# Patient Record
Sex: Female | Born: 1950 | Race: White | Hispanic: No | Marital: Married | Smoking: Current every day smoker
Health system: Southern US, Community
[De-identification: ages and names within clinical notes are randomized; demographics above are authoritative.]

## PROBLEM LIST (undated history)

## (undated) DIAGNOSIS — T7840XA Allergy, unspecified, initial encounter: Secondary | ICD-10-CM

## (undated) DIAGNOSIS — R519 Headache, unspecified: Secondary | ICD-10-CM

## (undated) DIAGNOSIS — R51 Headache: Secondary | ICD-10-CM

## (undated) DIAGNOSIS — Z8701 Personal history of pneumonia (recurrent): Secondary | ICD-10-CM

## (undated) HISTORY — DX: Allergy, unspecified, initial encounter: T78.40XA

## (undated) HISTORY — PX: DENTAL SURGERY: SHX609

## (undated) HISTORY — PX: HERNIA REPAIR: SHX51

---

## 2005-10-31 ENCOUNTER — Other Ambulatory Visit: Admission: RE | Admit: 2005-10-31 | Discharge: 2005-10-31 | Payer: Self-pay | Admitting: Obstetrics and Gynecology

## 2005-11-27 ENCOUNTER — Encounter: Admission: RE | Admit: 2005-11-27 | Discharge: 2005-11-27 | Payer: Self-pay | Admitting: Obstetrics and Gynecology

## 2006-05-09 ENCOUNTER — Ambulatory Visit (HOSPITAL_BASED_OUTPATIENT_CLINIC_OR_DEPARTMENT_OTHER): Admission: RE | Admit: 2006-05-09 | Discharge: 2006-05-09 | Payer: Self-pay | Admitting: Dentistry

## 2006-12-01 ENCOUNTER — Encounter: Admission: RE | Admit: 2006-12-01 | Discharge: 2006-12-01 | Payer: Self-pay | Admitting: Obstetrics and Gynecology

## 2007-12-02 ENCOUNTER — Encounter: Admission: RE | Admit: 2007-12-02 | Discharge: 2007-12-02 | Payer: Self-pay | Admitting: Obstetrics and Gynecology

## 2008-03-20 ENCOUNTER — Emergency Department (HOSPITAL_COMMUNITY): Admission: EM | Admit: 2008-03-20 | Discharge: 2008-03-20 | Payer: Self-pay | Admitting: Emergency Medicine

## 2008-09-21 ENCOUNTER — Encounter: Admission: RE | Admit: 2008-09-21 | Discharge: 2008-09-21 | Payer: Self-pay | Admitting: Emergency Medicine

## 2008-12-02 ENCOUNTER — Encounter: Admission: RE | Admit: 2008-12-02 | Discharge: 2008-12-02 | Payer: Self-pay | Admitting: Obstetrics and Gynecology

## 2009-12-04 ENCOUNTER — Encounter: Admission: RE | Admit: 2009-12-04 | Discharge: 2009-12-04 | Payer: Self-pay | Admitting: Obstetrics and Gynecology

## 2010-02-17 ENCOUNTER — Encounter: Payer: Self-pay | Admitting: Obstetrics and Gynecology

## 2010-05-15 LAB — COMPREHENSIVE METABOLIC PANEL
AST: 31 U/L (ref 0–37)
Albumin: 4.1 g/dL (ref 3.5–5.2)
Alkaline Phosphatase: 65 U/L (ref 39–117)
BUN: 15 mg/dL (ref 6–23)
Chloride: 106 mEq/L (ref 96–112)
GFR calc Af Amer: >60 mL/min (ref >60)
Potassium: 3.9 mEq/L (ref 3.5–5.1)
Total Bilirubin: 0.5 mg/dL (ref 0.3–1.2)

## 2010-05-15 LAB — DIFFERENTIAL
Basophils Absolute: 0.1 10*3/uL (ref 0.0–0.1)
Basophils Relative: 1 % (ref 0–1)
Eosinophils Relative: 2 % (ref 0–5)
Monocytes Absolute: 0.4 10*3/uL (ref 0.1–1.0)

## 2010-05-15 LAB — ETHANOL: Alcohol, Ethyl (B): 268 mg/dL — ABNORMAL HIGH (ref 0–10)

## 2010-05-15 LAB — CBC
HCT: 42.5 % (ref 36.0–46.0)
Platelets: 207 10*3/uL (ref 150–400)
WBC: 8 10*3/uL (ref 4.0–10.5)

## 2010-06-15 NOTE — Op Note (Signed)
Kimberly Le, Kimberly Le              ACCOUNT NO.:  [id number]   MEDICAL RECORD NO.:  [id number]          PATIENT TYPE:  AMB   LOCATION:  DSC                          FACILITY:  [facility]   PHYSICIAN:  Clydene Laming., D.D.S.DATE OF BIRTH:  [birth year]   DATE OF PROCEDURE:  05/09/2006  DATE OF DISCHARGE:                               OPERATIVE REPORT   Patient was premedicated and brought to the OR, placed on the table in  the supine position in which she remained throughout the entire  procedure.  After successful induction of nasoendotracheal anesthesia  the patient was prepped and draped in the usual manner for an intraoral  procedure.  The mouth and pharynx were suctioned dry.  No throat pack  was placed.  The axillary arch was infiltrated and mandibular arch was  anesthetized with bilateral block anesthesia with facial infiltration  using 9 dental carpules 1.8 mL of 2% lidocaine, 1:100,000 epinephrine.  In the maxillary arch there were inversed double incisions, facial and  palatal, distal wedges 2 and 15, full thickness flaps were elevated.  Teeth 2 and 12 were extracted.  In the mandibular arch, inversed bevel  incisions were facial and lingual.  There was a pie-shaped distal wedge  distal of #18.  Full thickness flaps were elevated, a #32 was extracted  in toto.  There was a very heavy calculus, scaled and replaned with hand  instrumentation and sonic instrumentation.  There was further thinning  and scalloping of soft tissues, osteoplasty was carried out where  indicated.  There were no significant vertical defects.  The extraction  site of #12 was degranulated.  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  Resorbable membranes were used and the socket was crafted with freeze-  dried bone.  After thorough irrigation flaps were irrigated.  They were  further thinned and scalloped.  Suture using 4-0 black silk suture  interrupted.  The mouth was suctioned dry.  Patient was then extubated  on the table and found to be breathing well on her own.  Blood loss was  minimal, there were no cultures or drains, there was no biopsy.  The  procedure went very smoothly.  It lasted approximately 3.5 hours.  The  patient was returned to PACU where she remained until stable.  Her  husband was with her to escort her home and he was also present for our  preoperative review of procedure.           ______________________________  Clydene Laming., D.D.S.     RJK/MEDQ  D:  05/09/2006  T:  05/09/2006  Job:  [id number]

## 2010-09-10 ENCOUNTER — Encounter: Payer: Self-pay | Admitting: Podiatry

## 2010-11-22 ENCOUNTER — Other Ambulatory Visit: Payer: Self-pay | Admitting: Obstetrics and Gynecology

## 2010-11-22 DIAGNOSIS — Z1231 Encounter for screening mammogram for malignant neoplasm of breast: Secondary | ICD-10-CM

## 2010-12-14 ENCOUNTER — Ambulatory Visit
Admission: RE | Admit: 2010-12-14 | Discharge: 2010-12-14 | Disposition: A | Discharge status: Home / Self Care | Payer: 59 | Source: Ambulatory Visit | Attending: Obstetrics and Gynecology | Admitting: Obstetrics and Gynecology

## 2010-12-14 DIAGNOSIS — Z1231 Encounter for screening mammogram for malignant neoplasm of breast: Secondary | ICD-10-CM

## 2012-01-17 ENCOUNTER — Other Ambulatory Visit: Payer: Self-pay | Admitting: Obstetrics and Gynecology

## 2012-01-17 DIAGNOSIS — Z1231 Encounter for screening mammogram for malignant neoplasm of breast: Secondary | ICD-10-CM

## 2012-02-13 ENCOUNTER — Ambulatory Visit: Payer: 59

## 2012-02-14 ENCOUNTER — Ambulatory Visit
Admission: RE | Admit: 2012-02-14 | Discharge: 2012-02-14 | Disposition: A | Discharge status: Home / Self Care | Payer: Managed Care, Other (non HMO) | Source: Ambulatory Visit | Attending: Obstetrics and Gynecology | Admitting: Obstetrics and Gynecology

## 2012-02-14 DIAGNOSIS — Z1231 Encounter for screening mammogram for malignant neoplasm of breast: Secondary | ICD-10-CM

## 2012-04-06 ENCOUNTER — Encounter: Payer: Self-pay | Admitting: Internal Medicine

## 2012-05-07 ENCOUNTER — Ambulatory Visit (AMBULATORY_SURGERY_CENTER): Payer: Managed Care, Other (non HMO) | Admitting: *Deleted

## 2012-05-07 ENCOUNTER — Encounter: Payer: Self-pay | Admitting: Internal Medicine

## 2012-05-07 VITALS — Ht 68.0 in | Wt 154.0 lb

## 2012-05-07 DIAGNOSIS — Z1211 Encounter for screening for malignant neoplasm of colon: Secondary | ICD-10-CM

## 2012-05-07 MED ORDER — PEG-KCL-NACL-NASULF-NA ASC-C 100 G PO SOLR: ORAL | Status: DC

## 2012-05-07 NOTE — Progress Notes (Signed)
No egg or soy allergy 

## 2012-05-21 ENCOUNTER — Ambulatory Visit (AMBULATORY_SURGERY_CENTER): Payer: Managed Care, Other (non HMO) | Admitting: Internal Medicine

## 2012-05-21 ENCOUNTER — Encounter: Payer: Self-pay | Admitting: Internal Medicine

## 2012-05-21 VITALS — BP 127/81 | HR 76 | Temp 97.5°F | Resp 25 | Ht 68.0 in | Wt 154.0 lb

## 2012-05-21 DIAGNOSIS — Z1211 Encounter for screening for malignant neoplasm of colon: Secondary | ICD-10-CM

## 2012-05-21 DIAGNOSIS — D126 Benign neoplasm of colon, unspecified: Secondary | ICD-10-CM

## 2012-05-21 MED ORDER — SODIUM CHLORIDE 0.9 % IV SOLN: 500.0000 mL | INTRAVENOUS | Status: DC

## 2012-05-21 NOTE — Progress Notes (Signed)
Called to room to assist during endoscopic procedure.  Patient ID and intended procedure confirmed with present staff. Received instructions for my participation in the procedure from the performing physician.  

## 2012-05-21 NOTE — Op Note (Signed)
[facility] [address] [place], [address]   COLONOSCOPY PROCEDURE REPORT  PATIENT: Kimberly Le, Kimberly Le  MR#: [id number] BIRTHDATE: 1950/12/25 , 61  yrs. old GENDER: Female ENDOSCOPIST: Beverley Fiedler, MD REFERRED XB:[id number], Todd PROCEDURE DATE:  05/21/2012 PROCEDURE:   Colonoscopy with snare polypectomy ASA CLASS:   Class I INDICATIONS:average risk screening and first colonoscopy. MEDICATIONS: MAC sedation, administered by CRNA and propofol (Diprivan) 400mg  IV  DESCRIPTION OF PROCEDURE:   After the risks benefits and alternatives of the procedure were thoroughly explained, informed consent was obtained.  A digital rectal exam revealed no rectal mass.   The LB PCF-[id number]  endoscope was introduced through the anus and advanced to the cecum, which was identified by both the appendix and ileocecal valve. No adverse events experienced. The quality of the prep was good, using MoviPrep  The instrument was then slowly withdrawn as the colon was fully examined.   COLON FINDINGS: A sessile polyp measuring 5 mm in size was found in the rectosigmoid colon.  A polypectomy was performed with a cold snare.  The resection was complete and the polyp tissue was completely retrieved.   Moderate diverticulosis was noted at the cecum, in the ascending colon, descending colon, and sigmoid colon. Retroflexed views revealed no abnormalities. The time to cecum=7 minutes 45 seconds.  Withdrawal time=9 minutes 11 seconds.  The scope was withdrawn and the procedure completed. COMPLICATIONS: There were no complications.  ENDOSCOPIC IMPRESSION: 1.   Sessile polyp measuring 5 mm in size was found in the rectosigmoid colon; polypectomy was performed with a cold snare 2.   Moderate diverticulosis was noted at the cecum, in the ascending colon, descending colon, and sigmoid colon  RECOMMENDATIONS: 1.  Await pathology results 2.  High fiber diet 3.  If the polyp removed today is  proven to be an adenomatous (pre-cancerous) polyp, you will need a repeat colonoscopy in 5 years.  Otherwise you should continue to follow colorectal cancer screening guidelines for "routine risk" patients with colonoscopy in 10 years.  You will receive a letter within 1-2 weeks with the results of your biopsy as well as final recommendations.  Please call my office if you have not received a letter after 3 weeks.   eSigned:  Beverley Fiedler, MD 05/21/2012 8:54 AM   cc: The Patient

## 2012-05-21 NOTE — Patient Instructions (Addendum)

## 2012-05-21 NOTE — Progress Notes (Signed)
Patient did not experience any of the following events: a burn prior to discharge; a fall within the facility; wrong site/side/patient/procedure/implant event; or a hospital transfer or hospital admission upon discharge from the facility. (G8907) Patient did not have preoperative order for IV antibiotic SSI prophylaxis. (G8918)  

## 2012-05-22 ENCOUNTER — Telehealth: Payer: Self-pay | Admitting: *Deleted

## 2012-05-22 NOTE — Telephone Encounter (Signed)
  Follow up Call-  Call back number 05/21/2012  Post procedure Call Back phone  # [phone]  Permission to leave phone message Yes    [facility]

## 2012-05-27 ENCOUNTER — Encounter: Payer: Self-pay | Admitting: Internal Medicine

## 2013-01-25 ENCOUNTER — Other Ambulatory Visit: Payer: Self-pay

## 2013-01-25 DIAGNOSIS — Z1231 Encounter for screening mammogram for malignant neoplasm of breast: Secondary | ICD-10-CM

## 2013-02-19 ENCOUNTER — Ambulatory Visit: Payer: Managed Care, Other (non HMO)

## 2013-03-10 ENCOUNTER — Ambulatory Visit: Payer: Managed Care, Other (non HMO)

## 2014-07-28 ENCOUNTER — Emergency Department (HOSPITAL_COMMUNITY): Payer: BLUE CROSS/BLUE SHIELD

## 2014-07-28 ENCOUNTER — Encounter (HOSPITAL_COMMUNITY): Payer: Self-pay | Admitting: Emergency Medicine

## 2014-07-28 ENCOUNTER — Emergency Department (HOSPITAL_COMMUNITY)
Admission: EM | Admit: 2014-07-28 | Discharge: 2014-07-28 | Disposition: A | Discharge status: Home / Self Care | Payer: BLUE CROSS/BLUE SHIELD | Attending: Emergency Medicine | Admitting: Emergency Medicine

## 2014-07-28 DIAGNOSIS — W010XXA Fall on same level from slipping, tripping and stumbling without subsequent striking against object, initial encounter: Secondary | ICD-10-CM | POA: Diagnosis not present

## 2014-07-28 DIAGNOSIS — S82892A Other fracture of left lower leg, initial encounter for closed fracture: Secondary | ICD-10-CM

## 2014-07-28 DIAGNOSIS — Y92009 Unspecified place in unspecified non-institutional (private) residence as the place of occurrence of the external cause: Secondary | ICD-10-CM | POA: Diagnosis not present

## 2014-07-28 DIAGNOSIS — Z79899 Other long term (current) drug therapy: Secondary | ICD-10-CM | POA: Diagnosis not present

## 2014-07-28 DIAGNOSIS — Y998 Other external cause status: Secondary | ICD-10-CM | POA: Diagnosis not present

## 2014-07-28 DIAGNOSIS — S99912A Unspecified injury of left ankle, initial encounter: Secondary | ICD-10-CM | POA: Diagnosis present

## 2014-07-28 DIAGNOSIS — Y9389 Activity, other specified: Secondary | ICD-10-CM | POA: Diagnosis not present

## 2014-07-28 DIAGNOSIS — S9305XA Dislocation of left ankle joint, initial encounter: Secondary | ICD-10-CM | POA: Diagnosis not present

## 2014-07-28 DIAGNOSIS — S82852A Displaced trimalleolar fracture of left lower leg, initial encounter for closed fracture: Secondary | ICD-10-CM | POA: Diagnosis not present

## 2014-07-28 DIAGNOSIS — Z720 Tobacco use: Secondary | ICD-10-CM | POA: Insufficient documentation

## 2014-07-28 MED ORDER — SODIUM CHLORIDE 0.9 % IV SOLN
INTRAVENOUS | Status: AC | PRN
  Administered 2014-07-28: 1000 mL via INTRAVENOUS

## 2014-07-28 MED ORDER — HYDROMORPHONE HCL 1 MG/ML IJ SOLN
1.0000 mg | Freq: Once | INTRAMUSCULAR | Status: AC
  Administered 2014-07-28: 1 mg via INTRAVENOUS
  Filled 2014-07-28: qty 1

## 2014-07-28 MED ORDER — ONDANSETRON HCL 4 MG/2ML IJ SOLN
4.0000 mg | Freq: Once | INTRAMUSCULAR | Status: AC
  Administered 2014-07-28: 4 mg via INTRAVENOUS
  Filled 2014-07-28: qty 2

## 2014-07-28 MED ORDER — OXYCODONE-ACETAMINOPHEN 5-325 MG PO TABS: 1.0000 | ORAL_TABLET | Freq: Four times a day (QID) | ORAL | Status: DC | PRN

## 2014-07-28 MED ORDER — ONDANSETRON 8 MG PO TBDP: 8.0000 mg | ORAL_TABLET | Freq: Three times a day (TID) | ORAL | Status: DC | PRN

## 2014-07-28 MED ORDER — PROPOFOL 10 MG/ML IV BOLUS
0.5000 mg/kg | Freq: Once | INTRAVENOUS | Status: AC
  Administered 2014-07-28: 33.1 mg via INTRAVENOUS
  Filled 2014-07-28: qty 1

## 2014-07-28 NOTE — ED Notes (Signed)
Per ems pt was seen at PCP for left ankle injury. Pt fell yesterday when tripped over her two dogs. Pt presents with splint by ems. Pt received 100 mcg Fentanyl IV. Xray  at PCP and confirmed fractured ankle.

## 2014-07-28 NOTE — ED Notes (Signed)
Bed: KP22 Expected date:  Expected time:  Means of arrival:  Comments: Ems- ankle injury

## 2014-07-28 NOTE — Discharge Instructions (Signed)
It was our pleasure to provide your ER care today - we hope that you feel better.  Elevate your ankle (above the level of your heart), as much as possible for the next few days - this will help both your swelling and pain a lot.  No weight bearing on your left foot/leg until cleared to do so by your orthopedist.  Use crutches. Keep splint clean/dry.   Ice/coldpacks to sore area.  You may take percocet as need for pain. No driving for the next day, or when taking percocet. Also, do not take tylenol or acetaminophen containing medication when taking percocet.   You may take zofran as need for nausea.   Pain medication can cause constipation.  If constipated, drink adequate fluids, get adequate fiber in diet, and take colace (stool softener) and miralax (laxative) as need.   Follow up with orthopedist tomorrow - be at his office at 830 AM - he will see you then, likely re-xray your ankle, and discuss/plan surgery for next week once the swelling is better.   Return to ER if worse, new symptoms, severe or intractable pain, numbness/weakness, new symptoms, other concern.       Ankle Fracture A fracture is a break in a bone. The ankle joint is made up of three bones. These include the lower (distal)sections of your lower leg bones, called the tibia and fibula, along with a bone in your foot, called the talus. Depending on how bad the break is and if more than one ankle joint bone is broken, a cast or splint is used to protect and keep your injured bone from moving while it heals. Sometimes, surgery is required to help the fracture heal properly.  There are two general types of fractures:  Stable fracture. This includes a single fracture line through one bone, with no injury to ankle ligaments. A fracture of the talus that does not have any displacement (movement of the bone on either side of the fracture line) is also stable.  Unstable fracture. This includes more than one fracture line  through one or more bones in the ankle joint. It also includes fractures that have displacement of the bone on either side of the fracture line. CAUSES  A direct blow to the ankle.   Quickly and severely twisting your ankle.  Trauma, such as a car accident or falling from a significant height. RISK FACTORS You may be at a higher risk of ankle fracture if:  You have certain medical conditions.  You are involved in high-impact sports.  You are involved in a high-impact car accident. SIGNS AND SYMPTOMS  1. Tender and swollen ankle. 2. Bruising around the injured ankle. 3. Pain on movement of the ankle. 4. Difficulty walking or putting weight on the ankle. 5. A cold foot below the site of the ankle injury. This can occur if the blood vessels passing through your injured ankle were also damaged. 6. Numbness in the foot below the site of the ankle injury. DIAGNOSIS  An ankle fracture is usually diagnosed with a physical exam and X-rays. A CT scan may also be required for complex fractures. TREATMENT  Stable fractures are treated with a cast or splint and using crutches to avoid putting weight on your injured ankle. This is followed by an ankle strengthening program. Some patients require a special type of cast, depending on other medical problems they may have. Unstable fractures require surgery to ensure the bones heal properly. Your health care provider will tell  you what type of fracture you have and the best treatment for your condition. HOME CARE INSTRUCTIONS  1. Review correct crutch use with your health care provider and use your crutches as directed. Safe use of crutches is extremely important. Misuse of crutches can cause you to fall or cause injury to nerves in your hands or armpits. 2. Do not put weight or pressure on the injured ankle until directed by your health care provider. 3. To lessen the swelling, keep the injured leg elevated while sitting or lying down. 4. Apply ice to  the injured area: 1. Put ice in a plastic bag. 2. Place a towel between your cast and the bag. 3. Leave the ice on for 20 minutes, 2-3 times a day. 5. If you have a plaster or fiberglass cast: 1. Do not try to scratch the skin under the cast with any objects. This can increase your risk of skin infection. 2. Check the skin around the cast every day. You may put lotion on any red or sore areas. 3. Keep your cast dry and clean. 6. If you have a plaster splint: 1. Wear the splint as directed. 2. You may loosen the elastic around the splint if your toes become numb, tingle, or turn cold or blue. 7. Do not put pressure on any part of your cast or splint; it may break. Rest your cast only on a pillow the first 24 hours until it is fully hardened. 8. Your cast or splint can be protected during bathing with a plastic bag sealed to your skin with medical tape. Do not lower the cast or splint into water. 9. Take medicines as directed by your health care provider. Only take over-the-counter or prescription medicines for pain, discomfort, or fever as directed by your health care provider. 10. Do not drive a vehicle until your health care provider specifically tells you it is safe to do so. 11. If your health care provider has given you a follow-up appointment, it is very important to keep that appointment. Not keeping the appointment could result in a chronic or permanent injury, pain, and disability. If you have any problem keeping the appointment, call the facility for assistance. SEEK MEDICAL CARE IF: You develop increased swelling or discomfort. SEEK IMMEDIATE MEDICAL CARE IF:  1. Your cast gets damaged or breaks. 2. You have continued severe pain. 3. You develop new pain or swelling after the cast was put on. 4. Your skin or toenails below the injury turn blue or gray. 5. Your skin or toenails below the injury feel cold, numb, or have loss of sensitivity to touch. 6. There is a bad smell or pus  draining from under the cast. MAKE SURE YOU:  1. Understand these instructions. 2. Will watch your condition. 3. Will get help right away if you are not doing well or get worse. Document Released: 01/12/2000 Document Revised: 01/19/2013 Document Reviewed: 08/13/2012 [facility] Patient Information 2015 [place], [place]. This information is not intended to replace advice given to you by your health care provider. Make sure you discuss any questions you have with your health care provider.    Trimalleolar Fracture, Ankle, Adult, Displaced (ORIF) A trimalleolar fracture (break in bone) is three fractures in the lower bones of your leg that make up your ankle. These fractures are in the bone you feel as the bump on the outside of your ankle (fibula) and the bone that you feel as the bump on the inside of your ankle (tibia). Your  fractures are displaced. This means the bones are not in their normal position and will not give a good result if they heal in that position. Because of this, surgery is required. This is called an open reduction and internal fixation (ORIF). Even with the best of care and perfect results this ankle may be more prone to be arthritis later due to damage of the cartilage lining the ankle joint which is not visible on x-ray. These fractures are diagnosed with x-rays. TREATMENT  You have fractures that would probably heal with disability, without surgery. Open reduction means that the area of the fracture is opened up to the vision of the surgeon and internal fixation means that a screw, pins or fixation device is used to hold the boney pieces in place. After surgery a short-leg cast or removable fracture boot is then applied from your toes to below your knee. This is generally left in place for about 5 to 6 weeks, during which time it is followed by your caregiver and x-rays may be taken to make sure the bones stay in place. LET YOUR CAREGIVER KNOW ABOUT:  Allergies  Medications taken  including herbs, eye drops, over the counter medications, and creams  Use of steroids (by mouth or creams)  History of bleeding or blood problems  A family history of anesthetic problems.  Previous problems with anesthetics or novocaine, including a family history of these problems.  Possibility of pregnancy, if this applies  History of blood clots (thrombophlebitis)  Previous surgery  Other health problems RISKS AND COMPLICATIONS All surgery is associated with risks. Some of these risks are:  Excessive bleeding  Infection  Post traumatic arthritis.  Failure to heal properly resulting in an unstable or arthritic ankle  Stiffness of ankle following repair  The need to have some of the hardware removed. BEFORE AND AFTER SURGERY Prior to surgery an IV (intravenous line connected to your vein for giving fluids) may be started and you will be given an anesthetic (this may be medicine and gas to make you go to sleep or you may receive a spinal anesthetic which will make your legs numb for the period of time necessary for your surgery). You may also be given a regional anesthetic such as a spinal or epidural block. After surgery, you will be taken to the recovery area where a nurse will monitor your progress. You may have a catheter (a long, narrow, hollow tube) in your bladder following surgery that helps you pass your water. If you stay in the hospital, when you are awake, stable, taking fluids well and without complications, you will be returned to your room. You will receive physical therapy and other care until you are doing well and your caregiver feels it is safe for you to be transferred either to home or to an extended care facility. HOME CARE INSTRUCTIONS   You may resume normal diet and activities as directed or allowed.  Keep ice packs (a bag of ice wrapped in a towel) on the surgical area for twenty minutes, four times per day, for the first two days following surgery. Use  the ice only if OK with your surgeon or caregiver.  Elevate your ankle above your heart as much as possible for the first 24-48 hours after the operation.  Change dressings if necessary or as directed.  If you have a plaster or fiberglass cast:  Do not try to scratch the skin under the cast using sharp or pointed objects.  Check  the skin around the cast every day. You may put lotion on any red or sore areas.  Keep your cast dry and clean.  Do not put pressure on any part of your cast or splint until it is fully hardened.  Your cast or splint can be protected during bathing with a plastic bag. Do not lower the cast or splint into water.  Only take over-the-counter or prescription medicines for pain, discomfort, or fever as directed by your caregiver.  Use crutches as directed and do not exercise leg unless instructed.  These are not fractures to be taken lightly! If these bones become displaced and get out of position, it may eventually lead to arthritis and disability for the rest of your life. Problems often follow even the best of care. Follow the directions of your caregiver.  Keep appointments as directed.  Elevate your injured ankle above your heart as much as possible for the first 5-7 days.  If you are placed into a fracture boot after surgery only remove is as instructed by your caregiver. SEEK IMMEDIATE MEDICAL CARE IF:  7. Redness, swelling, numbness or increasing pain in the wound. 8. Pus coming from wound. 9. An unexplained oral temperature above 102 F (38.9 C), or as your caregiver suggests. 10. A bad smell coming from the wound or dressing. 11. A breaking open of the wound (edges not staying together) after sutures or staples have been removed. 12. Your skin or nails below the injury turn blue or gray, or feel cold or numb. 13. You develop severe pain under the cast or in your foot. Especially when someone else moves your toes. Follow all instructions given to you  by your caregiver, make and keep follow up appointments, and use crutches as directed. If you do not have a window in your cast for observing the wound, a discharge or minor bleeding may show up as a stain on the outside of your dressings, your cast or plaster splint. Report these findings to your caregiver. If you are given a fracture boot, minor bleeding on the dressing is common. This is not of major concern. Change the dressing as instructed by your caregiver. Document Released: 10/06/2001 Document Revised: 04/08/2011 Document Reviewed: 03/04/2013 [facility] Patient Information 2015 [place], [place]. This information is not intended to replace advice given to you by your health care provider. Make sure you discuss any questions you have with your health care provider.  Cryotherapy Cryotherapy means treatment with cold. Ice or gel packs can be used to reduce both pain and swelling. Ice is the most helpful within the first 24 to 48 hours after an injury or flare-up from overusing a muscle or joint. Sprains, strains, spasms, burning pain, shooting pain, and aches can all be eased with ice. Ice can also be used when recovering from surgery. Ice is effective, has very few side effects, and is safe for most people to use. PRECAUTIONS  Ice is not a safe treatment option for people with:  Raynaud phenomenon. This is a condition affecting small blood vessels in the extremities. Exposure to cold may cause your problems to return.  Cold hypersensitivity. There are many forms of cold hypersensitivity, including:  Cold urticaria. Red, itchy hives appear on the skin when the tissues begin to warm after being iced.  Cold erythema. This is a red, itchy rash caused by exposure to cold.  Cold hemoglobinuria. Red blood cells break down when the tissues begin to warm after being iced. The hemoglobin that carry  oxygen are passed into the urine because they cannot combine with blood proteins fast enough.  Numbness or  altered sensitivity in the area being iced. If you have any of the following conditions, do not use ice until you have discussed cryotherapy with your caregiver:  Heart conditions, such as arrhythmia, angina, or chronic heart disease.  High blood pressure.  Healing wounds or open skin in the area being iced.  Current infections.  Rheumatoid arthritis.  Poor circulation.  Diabetes. Ice slows the blood flow in the region it is applied. This is beneficial when trying to stop inflamed tissues from spreading irritating chemicals to surrounding tissues. However, if you expose your skin to cold temperatures for too long or without the proper protection, you can damage your skin or nerves. Watch for signs of skin damage due to cold. HOME CARE INSTRUCTIONS Follow these tips to use ice and cold packs safely.  Place a dry or damp towel between the ice and skin. A damp towel will cool the skin more quickly, so you may need to shorten the time that the ice is used.  For a more rapid response, add gentle compression to the ice.  Ice for no more than 10 to 20 minutes at a time. The bonier the area you are icing, the less time it will take to get the benefits of ice.  Check your skin after 5 minutes to make sure there are no signs of a poor response to cold or skin damage.  Rest 20 minutes or more between uses.  Once your skin is numb, you can end your treatment. You can test numbness by very lightly touching your skin. The touch should be so light that you do not see the skin dimple from the pressure of your fingertip. When using ice, most people will feel these normal sensations in this order: cold, burning, aching, and numbness.  Do not use ice on someone who cannot communicate their responses to pain, such as small children or people with dementia. HOW TO MAKE AN ICE PACK Ice packs are the most common way to use ice therapy. Other methods include ice massage, ice baths, and cryosprays. Muscle  creams that cause a cold, tingly feeling do not offer the same benefits that ice offers and should not be used as a substitute unless recommended by your caregiver. To make an ice pack, do one of the following: 14. Place crushed ice or a bag of frozen vegetables in a sealable plastic bag. Squeeze out the excess air. Place this bag inside another plastic bag. Slide the bag into a pillowcase or place a damp towel between your skin and the bag. 15. Mix 3 parts water with 1 part rubbing alcohol. Freeze the mixture in a sealable plastic bag. When you remove the mixture from the freezer, it will be slushy. Squeeze out the excess air. Place this bag inside another plastic bag. Slide the bag into a pillowcase or place a damp towel between your skin and the bag. SEEK MEDICAL CARE IF: 12. You develop white spots on your skin. This may give the skin a blotchy (mottled) appearance. 13. Your skin turns blue or pale. 14. Your skin becomes waxy or hard. 15. Your swelling gets worse. MAKE SURE YOU:  7. Understand these instructions. 8. Will watch your condition. 9. Will get help right away if you are not doing well or get worse. Document Released: 09/10/2010 Document Revised: 05/31/2013 Document Reviewed: 09/10/2010 [facility] Patient Information 2015 [place], [place]. This  information is not intended to replace advice given to you by your health care provider. Make sure you discuss any questions you have with your health care provider.   Crutch Use Crutches are used to take weight off one of your legs or feet when you stand or walk. It is important to use crutches that fit properly. When fitted properly:  Each crutch should be 2-3 finger widths below the armpit.  Your weight should be supported by your hand, and not by resting the armpit on the crutch.  RISKS AND COMPLICATIONS Damage to the nerves that extend from your armpit to your hand and arm. To prevent this from happening, make sure your crutches fit  properly and do not put pressure on your armpit when using them. HOW TO USE YOUR CRUTCHES If you have been instructed to use partial weight bearing, apply (bear) the amount of weight as your health care provider suggests. Do not bear weight in an amount that causes pain to the area of injury. Walking  Step with the crutches.  Swing the healthy leg slightly ahead of the crutches. Going Up Steps If there is no handrail:  Step up with the healthy leg.  Step up with the crutches and injured leg.  Continue in this way. If there is a handrail: 16. Hold both crutches in one hand. 17. Place your free hand on the handrail. 18. While putting your weight on your arms, lift your healthy leg to the step. 19. Bring the crutches and the injured leg up to that step. 20. Continue in this way. Going Down Steps Be very careful, as going down stairs with crutches is very challenging. If there is no handrail: 16. Step down with the injured leg and crutches. 17. Step down with the healthy leg. If there is a handrail: 10. Place your hand on the handrail. 11. Hold both crutches with your free hand. 12. Lower your injured leg and crutch to the step below you. Make sure to keep the crutch tips in the center of the step, never on the edge. 13. Lower your healthy leg to that step. 14. Continue in this way. Standing Up 4. Hold the injured leg forward. 5. Grab the armrest with one hand and the top of the crutches with the other hand. 6. Using these supports, pull yourself up to a standing position. Sitting Down 1. Hold the injured leg forward. 2. Grab the armrest with one hand and the top of the crutches with the other hand. 3. Lower yourself to a sitting position. SEEK MEDICAL CARE IF:  You still feel unsteady on your feet.  You develop new pain, for example in your armpits, back, shoulder, wrist, or hip.  You develop any numbness or tingling. SEEK IMMEDIATE MEDICAL CARE IF: You fall. Document  Released: 01/12/2000 Document Revised: 01/19/2013 Document Reviewed: 09/21/2012 [facility] Patient Information 2015 [place], [place]. This information is not intended to replace advice given to you by your health care provider. Make sure you discuss any questions you have with your health care provider.   Cast or Splint Care Casts and splints support injured limbs and keep bones from moving while they heal. It is important to care for your cast or splint at home.  HOME CARE INSTRUCTIONS  Keep the cast or splint uncovered during the drying period. It can take 24 to 48 hours to dry if it is made of plaster. A fiberglass cast will dry in less than 1 hour.  Do not rest the  cast on anything harder than a pillow for the first 24 hours.  Do not put weight on your injured limb or apply pressure to the cast until your health care provider gives you permission.  Keep the cast or splint dry. Wet casts or splints can lose their shape and may not support the limb as well. A wet cast that has lost its shape can also create harmful pressure on your skin when it dries. Also, wet skin can become infected.  Cover the cast or splint with a plastic bag when bathing or when out in the rain or snow. If the cast is on the trunk of the body, take sponge baths until the cast is removed.  If your cast does become wet, dry it with a towel or a blow dryer on the cool setting only.  Keep your cast or splint clean. Soiled casts may be wiped with a moistened cloth.  Do not place any hard or soft foreign objects under your cast or splint, such as cotton, toilet paper, lotion, or powder.  Do not try to scratch the skin under the cast with any object. The object could get stuck inside the cast. Also, scratching could lead to an infection. If itching is a problem, use a blow dryer on a cool setting to relieve discomfort.  Do not trim or cut your cast or remove padding from inside of it.  Exercise all joints next to the  injury that are not immobilized by the cast or splint. For example, if you have a long leg cast, exercise the hip joint and toes. If you have an arm cast or splint, exercise the shoulder, elbow, thumb, and fingers.  Elevate your injured arm or leg on 1 or 2 pillows for the first 1 to 3 days to decrease swelling and pain.It is best if you can comfortably elevate your cast so it is higher than your heart. SEEK MEDICAL CARE IF:   Your cast or splint cracks.  Your cast or splint is too tight or too loose.  You have unbearable itching inside the cast.  Your cast becomes wet or develops a soft spot or area.  You have a bad smell coming from inside your cast.  You get an object stuck under your cast.  Your skin around the cast becomes red or raw.  You have new pain or worsening pain after the cast has been applied. SEEK IMMEDIATE MEDICAL CARE IF:   You have fluid leaking through the cast.  You are unable to move your fingers or toes.  You have discolored (blue or white), cool, painful, or very swollen fingers or toes beyond the cast.  You have tingling or numbness around the injured area.  You have severe pain or pressure under the cast.  You have any difficulty with your breathing or have shortness of breath.  You have chest pain. Document Released: 01/12/2000 Document Revised: 11/04/2012 Document Reviewed: 07/23/2012 [facility] Patient Information 2015 [place], [place]. This information is not intended to replace advice given to you by your health care provider. Make sure you discuss any questions you have with your health care provider.

## 2014-07-28 NOTE — ED Provider Notes (Signed)
CSN: [id number]     Arrival date & time 07/28/14  85 History   First MD Initiated Contact with Patient 07/28/14 1107     Chief Complaint  Patient presents with  . left ankle injury      (Consider location/radiation/quality/duration/timing/severity/associated sxs/prior Treatment) The history is provided by the patient, the spouse and the EMS personnel.  Patient s/p trip and fall at home last pm, tripping over her two dogs, and injuring left ankle. Ankle pain constant, mod-severe, worse w movement. Unable to bear weight or walk on ankle. Skin intact. Denies knee pain or other injury. No faintness or dizziness prior to fall. No head injury w fall. No headache. No neck or back pain. Pt denies any other pain or injury. No numbness/weakness. No anticoag use. Last ate/drank last pm.      Past Medical History  Diagnosis Date  . Allergy     seasonal   Past Surgical History  Procedure Laterality Date  . Hernia repair    . Dental surgery     Family History  Problem Relation Age of Onset  . Colon cancer Neg Hx   . Esophageal cancer Neg Hx   . Rectal cancer Neg Hx   . Stomach cancer Neg Hx    History  Substance Use Topics  . Smoking status: Current Every Day Smoker -- 0.25 packs/day  . Smokeless tobacco: Never Used  . Alcohol Use: Yes     Comment: social   OB History    No data available     Review of Systems  Constitutional: Negative for fever.  HENT: Negative for nosebleeds.   Eyes: Negative for redness.  Respiratory: Negative for shortness of breath.   Cardiovascular: Negative for chest pain.  Gastrointestinal: Negative for nausea, vomiting and abdominal pain.  Genitourinary: Negative for flank pain.  Musculoskeletal: Negative for back pain and neck pain.  Skin: Negative for rash and wound.  Neurological: Negative for weakness, numbness and headaches.  Hematological: Does not bruise/bleed easily.  Psychiatric/Behavioral: Negative for confusion.      Allergies   Review of patient's allergies indicates no known allergies.  Home Medications   Prior to Admission medications   Medication Sig Start Date End Date Taking? Authorizing Provider  ALPRAZolam (XANAX) 0.25 MG tablet Take 0.25 mg by mouth 2 (two) times daily as needed for anxiety.  06/23/14  Yes Historical Provider, MD  buPROPion (WELLBUTRIN XL) 150 MG 24 hr tablet Take 150 mg by mouth daily.   Yes Historical Provider, MD  Multiple Vitamins-Minerals (CENTRUM PO) Take 1 tablet by mouth daily.    Yes Historical Provider, MD  venlafaxine (EFFEXOR) 75 MG tablet Take 75 mg by mouth daily.   Yes Historical Provider, MD  peg 3350 powder (MOVIPREP) 100 G SOLR Moviprep as directed, no substitutions Patient not taking: Reported on 07/28/2014 05/07/12   Jerene Bears, MD   BP 144/70 mmHg  Pulse 71  Temp(Src) 98.1 F (36.7 C) (Oral)  Resp 20  SpO2 94% Physical Exam  Constitutional: She is oriented to person, place, and time. She appears well-developed and well-nourished. No distress.  HENT:  Head: Atraumatic.  Eyes: Conjunctivae are normal. No scleral icterus.  Neck: Neck supple. No tracheal deviation present.  Cardiovascular: Normal rate and intact distal pulses.   Pulmonary/Chest: Effort normal. No respiratory distress.  Abdominal: Normal appearance. She exhibits no distension.  Musculoskeletal:  Moderate swelling to left ankle. Distal pulses palp. No proximal tib/fib or knee tenderness. No other focal bony tenderness  on ext exam. CTLS spine, non tender, aligned, no step off. Small superficial abrasion to ankle- pt indicates was present prior to injury. No open fx/lac.    Neurological: She is alert and oriented to person, place, and time.  Able to move toes/foot. sens grossly intact.   Skin: Skin is warm and dry. No rash noted.  Psychiatric: She has a normal mood and affect.  Nursing note and vitals reviewed.   ED Course  Procedures (including critical care time) Labs Review  Dg Ankle  Complete Left  07/28/2014   CLINICAL DATA:  Post reduction.  Recent fall.  EXAM: LEFT ANKLE COMPLETE - 3+ VIEW  COMPARISON:  Radiograph earlier today.  FINDINGS: Improved position alignment of trimalleolar fracture and posterior dislocation. There is still significant disruption of the ankle mortise, and anterior displacement of the tibia on the talus. Posterior splint satisfactory position.  IMPRESSION: As above.   Electronically Signed   By: Staci Righter M.D.   On: 07/28/2014 12:47   Dg Ankle Complete Left  07/28/2014   CLINICAL DATA:  64 year old female with a history of fall and injury.  EXAM: LEFT ANKLE COMPLETE - 3+ VIEW  COMPARISON:  None.  FINDINGS: Fracture dislocation at the ankle.  Comminuted fracture of the distal fibular diaphysis, above the ankle mortise, with 23 degrees of angulation anteriorly.  Comminuted fracture of the distal tibia, involving the posterior malleolus and the medial malleolus. There has been posterior dislocation of the talus from the ankle mortise.  IMPRESSION: Fracture dislocation of the ankle, with posterior dislocation of the talus.  Comminuted fracture of the distal tibia involving the medial malleolus and the posterior malleolus. There is also comminuted fracture of the distal fibula, with approximately 23 degrees of anterior angulation at the fracture site  Signed,  Dulcy Fanny. Earleen Newport, DO  [facility]  [facility]   Electronically Signed   By: Corrie Mckusick D.O.   On: 07/28/2014 11:29       MDM   Iv ns. Xrays.  Dilaudid iv. zofran iv.  Reviewed nursing notes and prior charts for additional history.   Orthopedist paged.  Dr Alain Marion reviewed initial films, requests we reduce in ED and call him back.  Propofol/procedural sedation used. Initial 4 cc/40 mg with incomplete sedation.  An additional 4 cc given, w good sedation.  w ortho tech, fx/dislocation reduced, tends to want to sublux back anter, reduction  maintained, swelling and appearance improved, distal pulses palp.  Pt tolerated procedure well. Skin remains intact. Dp/pt 2+. Posterior splint with stirrups, and very good padding applied.   Again discussed with Dr Percell Miller who reviewed post reduction films - indicates to leave as is, no wt bearing, and have f/u at 0830 tomorrow w him, w tentative plan for surgery early/mid next week.  Recheck pt, fully awake and alert, tolerating po fluids. No new c/o. Pain controlled. No numbness/tingling/weakness. Sensation and movement intact in toes, dp palp, normal cap refill in toes.   Discussed elevation, no wt bearing, and f/u plan. Pt/spouse agreeable.  Procedural sedation Performed by: Mirna Mires Consent: Verbal consent obtained. Risks and benefits: risks, benefits and alternatives were discussed Required items: required blood products, implants, devices, and special equipment available Patient identity confirmed: arm band and provided demographic data Time out: Immediately prior to procedure a "time out" was called to verify the correct patient, procedure, equipment, support staff and site/side marked as required.  Sedation type: moderate (conscious) sedation NPO time confirmed and  considedered  Sedatives: PROPOFOL  Physician Time at Bedside: 30 min  Vitals: Vital signs were monitored during sedation. Cardiac Monitor, pulse oximeter Patient tolerance: Patient tolerated the procedure well with no immediate complications. Comments: Pt with uneventful recovered. Returned to pre-procedural sedation baseline     Lajean Saver, MD 07/28/14 1353

## 2014-07-28 NOTE — Progress Notes (Signed)
Pt with left ankle fx  CM assessed pt for support with getting medications, food, ADLs  Pt has female visitor at bedside to state he will be assisting with all of these needs for pt and will assist with getting her to appt with Dr Percell Miller on 07/29/14  Pt has crutches in room No identified need at this time

## 2014-07-29 ENCOUNTER — Observation Stay (HOSPITAL_COMMUNITY)
Admission: AD | Admit: 2014-07-29 | Discharge: 2014-07-30 | Disposition: A | Discharge status: Home / Self Care | Payer: BLUE CROSS/BLUE SHIELD | Source: Ambulatory Visit | Attending: Orthopedic Surgery | Admitting: Orthopedic Surgery

## 2014-07-29 ENCOUNTER — Encounter (HOSPITAL_COMMUNITY): Payer: Self-pay | Admitting: General Practice

## 2014-07-29 ENCOUNTER — Ambulatory Visit (HOSPITAL_COMMUNITY): Payer: BLUE CROSS/BLUE SHIELD

## 2014-07-29 ENCOUNTER — Encounter (HOSPITAL_COMMUNITY)
Admission: AD | Disposition: A | Discharge status: Home / Self Care | Payer: Self-pay | Source: Ambulatory Visit | Attending: Orthopedic Surgery

## 2014-07-29 ENCOUNTER — Ambulatory Visit (HOSPITAL_COMMUNITY): Payer: BLUE CROSS/BLUE SHIELD | Admitting: Anesthesiology

## 2014-07-29 DIAGNOSIS — S82852A Displaced trimalleolar fracture of left lower leg, initial encounter for closed fracture: Secondary | ICD-10-CM | POA: Diagnosis not present

## 2014-07-29 DIAGNOSIS — X58XXXA Exposure to other specified factors, initial encounter: Secondary | ICD-10-CM | POA: Diagnosis not present

## 2014-07-29 DIAGNOSIS — F17210 Nicotine dependence, cigarettes, uncomplicated: Secondary | ICD-10-CM | POA: Insufficient documentation

## 2014-07-29 DIAGNOSIS — S82899A Other fracture of unspecified lower leg, initial encounter for closed fracture: Secondary | ICD-10-CM | POA: Diagnosis present

## 2014-07-29 HISTORY — DX: Headache, unspecified: R51.9

## 2014-07-29 HISTORY — PX: ORIF ANKLE FRACTURE: SHX5408

## 2014-07-29 HISTORY — DX: Personal history of pneumonia (recurrent): Z87.01

## 2014-07-29 HISTORY — DX: Headache: R51

## 2014-07-29 LAB — CBC
HCT: 38 % (ref 36.0–46.0)
Hemoglobin: 12.4 g/dL (ref 12.0–15.0)
MCH: 31.1 pg (ref 26.0–34.0)
MCHC: 32.6 g/dL (ref 30.0–36.0)
MCV: 95.2 fL (ref 78.0–100.0)
PLATELETS: 218 10*3/uL (ref 150–400)
RBC: 3.99 MIL/uL (ref 3.87–5.11)
RDW: 13.4 % (ref 11.5–15.5)
WBC: 7.3 10*3/uL (ref 4.0–10.5)

## 2014-07-29 LAB — BASIC METABOLIC PANEL
ANION GAP: 8 (ref 5–15)
BUN: 11 mg/dL (ref 6–20)
CO2: 28 mmol/L (ref 22–32)
CREATININE: 0.78 mg/dL (ref 0.44–1.00)
Calcium: 9.3 mg/dL (ref 8.9–10.3)
Chloride: 102 mmol/L (ref 101–111)
GFR calc Af Amer: >60 mL/min (ref >60)
GLUCOSE: 95 mg/dL (ref 65–99)
Potassium: 3.8 mmol/L (ref 3.5–5.1)
Sodium: 138 mmol/L (ref 135–145)

## 2014-07-29 LAB — SURGICAL PCR SCREEN
MRSA, PCR: NEGATIVE
STAPHYLOCOCCUS AUREUS: NEGATIVE

## 2014-07-29 SURGERY — OPEN REDUCTION INTERNAL FIXATION (ORIF) ANKLE FRACTURE
Anesthesia: General | Site: Ankle | Laterality: Left

## 2014-07-29 MED ORDER — ONDANSETRON HCL 4 MG PO TABS: 4.0000 mg | ORAL_TABLET | Freq: Four times a day (QID) | ORAL | Status: DC | PRN

## 2014-07-29 MED ORDER — DOCUSATE SODIUM 100 MG PO CAPS
100.0000 mg | ORAL_CAPSULE | Freq: Two times a day (BID) | ORAL | Status: DC
  Administered 2014-07-29 – 2014-07-30 (×2): 100 mg via ORAL
  Filled 2014-07-29 (×2): qty 1

## 2014-07-29 MED ORDER — VENLAFAXINE HCL ER 75 MG PO CP24
75.0000 mg | ORAL_CAPSULE | Freq: Every day | ORAL | Status: DC
  Administered 2014-07-30: 75 mg via ORAL
  Filled 2014-07-29 (×2): qty 1

## 2014-07-29 MED ORDER — OXYCODONE-ACETAMINOPHEN 5-325 MG PO TABS: 1.0000 | ORAL_TABLET | ORAL | Status: DC | PRN

## 2014-07-29 MED ORDER — ADULT MULTIVITAMIN W/MINERALS CH
1.0000 | ORAL_TABLET | Freq: Every day | ORAL | Status: DC
  Administered 2014-07-30: 1 via ORAL
  Filled 2014-07-29 (×2): qty 1

## 2014-07-29 MED ORDER — FENTANYL CITRATE (PF) 100 MCG/2ML IJ SOLN
INTRAMUSCULAR | Status: DC | PRN
  Administered 2014-07-29 (×2): 25 ug via INTRAVENOUS
  Administered 2014-07-29: 100 ug via INTRAVENOUS
  Administered 2014-07-29 (×2): 50 ug via INTRAVENOUS
  Administered 2014-07-29 (×2): 25 ug via INTRAVENOUS

## 2014-07-29 MED ORDER — CHLORHEXIDINE GLUCONATE 4 % EX LIQD: 60.0000 mL | Freq: Once | CUTANEOUS | Status: DC

## 2014-07-29 MED ORDER — FENTANYL CITRATE (PF) 250 MCG/5ML IJ SOLN
INTRAMUSCULAR | Status: AC
  Filled 2014-07-29: qty 5

## 2014-07-29 MED ORDER — LACTATED RINGERS IV SOLN
INTRAVENOUS | Status: DC
  Administered 2014-07-29 (×2): via INTRAVENOUS

## 2014-07-29 MED ORDER — PROPOFOL 10 MG/ML IV BOLUS
INTRAVENOUS | Status: DC | PRN
  Administered 2014-07-29: 20 mg via INTRAVENOUS
  Administered 2014-07-29: 160 mg via INTRAVENOUS

## 2014-07-29 MED ORDER — ALPRAZOLAM 0.25 MG PO TABS: 0.0625 mg | ORAL_TABLET | Freq: Every day | ORAL | Status: DC | PRN

## 2014-07-29 MED ORDER — ONDANSETRON HCL 4 MG/2ML IJ SOLN
4.0000 mg | Freq: Once | INTRAMUSCULAR | Status: DC | PRN
Start: 2014-07-29 — End: 2014-07-29

## 2014-07-29 MED ORDER — LIDOCAINE HCL (CARDIAC) 20 MG/ML IV SOLN
INTRAVENOUS | Status: DC | PRN
  Administered 2014-07-29: 40 mg via INTRAVENOUS

## 2014-07-29 MED ORDER — FENTANYL CITRATE (PF) 100 MCG/2ML IJ SOLN
INTRAMUSCULAR | Status: AC
  Filled 2014-07-29: qty 2

## 2014-07-29 MED ORDER — DOCUSATE SODIUM 100 MG PO CAPS: 100.0000 mg | ORAL_CAPSULE | Freq: Two times a day (BID) | ORAL | Status: AC

## 2014-07-29 MED ORDER — ONDANSETRON HCL 4 MG PO TABS: 4.0000 mg | ORAL_TABLET | Freq: Three times a day (TID) | ORAL | Status: AC | PRN

## 2014-07-29 MED ORDER — FENTANYL CITRATE (PF) 100 MCG/2ML IJ SOLN: 25.0000 ug | INTRAMUSCULAR | Status: DC | PRN

## 2014-07-29 MED ORDER — ONDANSETRON HCL 4 MG/2ML IJ SOLN: 4.0000 mg | Freq: Four times a day (QID) | INTRAMUSCULAR | Status: DC | PRN

## 2014-07-29 MED ORDER — FENTANYL CITRATE (PF) 100 MCG/2ML IJ SOLN
50.0000 ug | INTRAMUSCULAR | Status: DC | PRN
  Administered 2014-07-29: 50 ug via INTRAVENOUS

## 2014-07-29 MED ORDER — ACETAMINOPHEN 650 MG RE SUPP: 650.0000 mg | Freq: Four times a day (QID) | RECTAL | Status: DC | PRN

## 2014-07-29 MED ORDER — ONDANSETRON HCL 4 MG/2ML IJ SOLN
INTRAMUSCULAR | Status: DC | PRN
  Administered 2014-07-29: 4 mg via INTRAVENOUS

## 2014-07-29 MED ORDER — MIDAZOLAM HCL 2 MG/2ML IJ SOLN
INTRAMUSCULAR | Status: AC
  Administered 2014-07-29: 2 mg via INTRAVENOUS
  Filled 2014-07-29: qty 2

## 2014-07-29 MED ORDER — BUPROPION HCL ER (XL) 150 MG PO TB24
150.0000 mg | ORAL_TABLET | Freq: Every day | ORAL | Status: DC
  Administered 2014-07-30: 150 mg via ORAL
  Filled 2014-07-29 (×2): qty 1

## 2014-07-29 MED ORDER — POTASSIUM CHLORIDE IN NACL 20-0.45 MEQ/L-% IV SOLN
INTRAVENOUS | Status: DC
  Administered 2014-07-29: 23:00:00 via INTRAVENOUS
  Filled 2014-07-29 (×3): qty 1000

## 2014-07-29 MED ORDER — ALPRAZOLAM 0.25 MG PO TABS: 0.1250 mg | ORAL_TABLET | Freq: Every day | ORAL | Status: DC | PRN

## 2014-07-29 MED ORDER — ACETAMINOPHEN 500 MG PO TABS
1000.0000 mg | ORAL_TABLET | Freq: Once | ORAL | Status: AC
  Administered 2014-07-29: 1000 mg via ORAL
  Filled 2014-07-29: qty 2

## 2014-07-29 MED ORDER — HYDROMORPHONE HCL 1 MG/ML IJ SOLN: 1.0000 mg | INTRAMUSCULAR | Status: DC | PRN

## 2014-07-29 MED ORDER — OXYCODONE HCL 5 MG/5ML PO SOLN: 5.0000 mg | Freq: Once | ORAL | Status: DC | PRN

## 2014-07-29 MED ORDER — MUPIROCIN 2 % EX OINT
TOPICAL_OINTMENT | CUTANEOUS | Status: AC
  Administered 2014-07-29: 1
  Filled 2014-07-29: qty 22

## 2014-07-29 MED ORDER — OXYCODONE HCL 5 MG PO TABS
5.0000 mg | ORAL_TABLET | ORAL | Status: DC | PRN
  Administered 2014-07-29 – 2014-07-30 (×6): 10 mg via ORAL
  Filled 2014-07-29 (×5): qty 2

## 2014-07-29 MED ORDER — METOCLOPRAMIDE HCL 5 MG PO TABS
5.0000 mg | ORAL_TABLET | Freq: Three times a day (TID) | ORAL | Status: DC | PRN
Start: 2014-07-29 — End: 2014-07-30

## 2014-07-29 MED ORDER — MUPIROCIN CALCIUM 2 % EX CREA
TOPICAL_CREAM | Freq: Two times a day (BID) | CUTANEOUS | Status: DC
  Filled 2014-07-29: qty 15

## 2014-07-29 MED ORDER — CEFAZOLIN SODIUM-DEXTROSE 2-3 GM-% IV SOLR
2.0000 g | INTRAVENOUS | Status: AC
  Administered 2014-07-29: 2 g via INTRAVENOUS
  Filled 2014-07-29: qty 50

## 2014-07-29 MED ORDER — ACETAMINOPHEN 325 MG PO TABS
650.0000 mg | ORAL_TABLET | Freq: Four times a day (QID) | ORAL | Status: DC | PRN
  Administered 2014-07-30: 650 mg via ORAL
  Filled 2014-07-29: qty 2

## 2014-07-29 MED ORDER — CEFAZOLIN SODIUM 1-5 GM-% IV SOLN
1.0000 g | Freq: Four times a day (QID) | INTRAVENOUS | Status: AC
  Administered 2014-07-29 – 2014-07-30 (×3): 1 g via INTRAVENOUS
  Filled 2014-07-29 (×3): qty 50

## 2014-07-29 MED ORDER — METOCLOPRAMIDE HCL 5 MG/ML IJ SOLN: 5.0000 mg | Freq: Three times a day (TID) | INTRAMUSCULAR | Status: DC | PRN

## 2014-07-29 MED ORDER — MIDAZOLAM HCL 2 MG/2ML IJ SOLN
INTRAMUSCULAR | Status: AC
  Filled 2014-07-29: qty 2

## 2014-07-29 MED ORDER — MIDAZOLAM HCL 2 MG/2ML IJ SOLN
1.0000 mg | INTRAMUSCULAR | Status: DC | PRN
  Administered 2014-07-29: 1 mg via INTRAVENOUS

## 2014-07-29 MED ORDER — PROPOFOL 10 MG/ML IV BOLUS
INTRAVENOUS | Status: AC
  Filled 2014-07-29: qty 20

## 2014-07-29 MED ORDER — 0.9 % SODIUM CHLORIDE (POUR BTL) OPTIME
TOPICAL | Status: DC | PRN
  Administered 2014-07-29: 1000 mL

## 2014-07-29 MED ORDER — OXYCODONE HCL 5 MG PO TABS
ORAL_TABLET | ORAL | Status: AC
  Administered 2014-07-29: 10 mg via ORAL
  Filled 2014-07-29: qty 2

## 2014-07-29 MED ORDER — OXYCODONE HCL 5 MG PO TABS: 5.0000 mg | ORAL_TABLET | Freq: Once | ORAL | Status: DC | PRN

## 2014-07-29 MED ORDER — ASPIRIN 325 MG PO TABS: 325.0000 mg | ORAL_TABLET | Freq: Every day | ORAL | Status: DC

## 2014-07-29 SURGICAL SUPPLY — 88 items
BANDAGE ELASTIC 4 VELCRO ST LF (GAUZE/BANDAGES/DRESSINGS) ×8 IMPLANT
BANDAGE ESMARK 6X9 LF (GAUZE/BANDAGES/DRESSINGS) IMPLANT
BIT DRILL 2.5X125 (BIT) ×2 IMPLANT
BIT DRILL 3.5X125 (BIT) IMPLANT
BIT DRILL CANN 2.7 (BIT) ×2
BIT DRILL CANN 2.7MM (BIT) ×1
BIT DRILL COUNTER SINK (DRILL) IMPLANT
BIT DRILL SRG 2.7XCANN AO CPLG (BIT) IMPLANT
BIT DRL SRG 2.7XCANN AO CPLNG (BIT) ×1
BNDG CMPR 9X6 STRL LF SNTH (GAUZE/BANDAGES/DRESSINGS)
BNDG ESMARK 6X9 LF (GAUZE/BANDAGES/DRESSINGS)
CANISTER SUCTION 2500CC (MISCELLANEOUS) ×3 IMPLANT
CLOSURE WOUND 1/2 X4 (GAUZE/BANDAGES/DRESSINGS)
COVER SURGICAL LIGHT HANDLE (MISCELLANEOUS) ×3 IMPLANT
CUFF TOURNIQUET SINGLE 34IN LL (TOURNIQUET CUFF) IMPLANT
DRAPE C-ARM 42X72 X-RAY (DRAPES) IMPLANT
DRAPE C-ARMOR (DRAPES) ×1 IMPLANT
DRAPE OEC MINIVIEW 54X84 (DRAPES) ×2 IMPLANT
DRAPE ORTHO SPLIT 77X108 STRL (DRAPES)
DRAPE SURG ORHT 6 SPLT 77X108 (DRAPES) IMPLANT
DRAPE U-SHAPE 47X51 STRL (DRAPES) IMPLANT
DRILL BIT 3.5X125 (BIT) ×3
DRILL COUNTER SINK (DRILL) ×3
DRSG ADAPTIC 3X8 NADH LF (GAUZE/BANDAGES/DRESSINGS) ×2 IMPLANT
DRSG PAD ABDOMINAL 8X10 ST (GAUZE/BANDAGES/DRESSINGS) ×6 IMPLANT
DURAPREP 26ML APPLICATOR (WOUND CARE) ×3 IMPLANT
ELECT REM PT RETURN 9FT ADLT (ELECTROSURGICAL) ×3
ELECTRODE REM PT RTRN 9FT ADLT (ELECTROSURGICAL) ×1 IMPLANT
GAUZE SPONGE 4X4 12PLY STRL (GAUZE/BANDAGES/DRESSINGS) ×3 IMPLANT
GLOVE BIO SURGEON STRL SZ7 (GLOVE) ×3 IMPLANT
GLOVE BIO SURGEON STRL SZ7.5 (GLOVE) ×6 IMPLANT
GLOVE BIO SURGEON STRL SZ8 (GLOVE) ×3 IMPLANT
GLOVE BIOGEL PI IND STRL 7.0 (GLOVE) ×1 IMPLANT
GLOVE BIOGEL PI IND STRL 8 (GLOVE) ×1 IMPLANT
GLOVE BIOGEL PI INDICATOR 7.0 (GLOVE) ×2
GLOVE BIOGEL PI INDICATOR 8 (GLOVE) ×2
GLOVE BIOGEL PI ORTHO PRO SZ8 (GLOVE)
GLOVE PI ORTHO PRO STRL SZ8 (GLOVE) IMPLANT
GOWN STRL REUS W/ TWL LRG LVL3 (GOWN DISPOSABLE) ×1 IMPLANT
GOWN STRL REUS W/ TWL XL LVL3 (GOWN DISPOSABLE) ×1 IMPLANT
GOWN STRL REUS W/TWL 2XL LVL3 (GOWN DISPOSABLE) IMPLANT
GOWN STRL REUS W/TWL LRG LVL3 (GOWN DISPOSABLE) ×3
GOWN STRL REUS W/TWL XL LVL3 (GOWN DISPOSABLE) ×3
K-WIRE ORTHOPEDIC 1.4X150L (WIRE) ×9
KIT BASIN OR (CUSTOM PROCEDURE TRAY) ×3 IMPLANT
KIT ROOM TURNOVER OR (KITS) ×3 IMPLANT
KWIRE ORTHOPEDIC 1.4X150L (WIRE) IMPLANT
MANIFOLD NEPTUNE II (INSTRUMENTS) ×3 IMPLANT
NEEDLE 22X1 1/2 (OR ONLY) (NEEDLE) ×3 IMPLANT
NS IRRIG 1000ML POUR BTL (IV SOLUTION) ×3 IMPLANT
PACK ORTHO EXTREMITY (CUSTOM PROCEDURE TRAY) ×3 IMPLANT
PAD ABD 8X10 STRL (GAUZE/BANDAGES/DRESSINGS) ×2 IMPLANT
PAD ARMBOARD 7.5X6 YLW CONV (MISCELLANEOUS) ×6 IMPLANT
PAD CAST 4YDX4 CTTN HI CHSV (CAST SUPPLIES) ×2 IMPLANT
PADDING CAST ABS 4INX4YD NS (CAST SUPPLIES) ×6
PADDING CAST ABS COTTON 4X4 ST (CAST SUPPLIES) IMPLANT
PADDING CAST COTTON 4X4 STRL (CAST SUPPLIES) ×6
PLATE TUBULAR 8 HOLE 1/3 (Plate) ×2 IMPLANT
SCREW CANC 2.5XFT HEX12X4X (Screw) IMPLANT
SCREW CANC FT 16X4X2.5XHEX (Screw) IMPLANT
SCREW CANCELLOUS 4.0X12MM (Screw) ×3 IMPLANT
SCREW CANCELLOUS 4.0X16MM (Screw) ×6 IMPLANT
SCREW CANN 4.0X46MM (Screw) ×2 IMPLANT
SCREW CANN 44X15X4XSLF DRL (Screw) IMPLANT
SCREW CANN ASNIS 4.0X55 (Screw) ×2 IMPLANT
SCREW CANNULATED 4.0X44MM (Screw) ×9 IMPLANT
SCREW CORTEX ST MATTA 3.5X14 (Screw) ×6 IMPLANT
SCREW CORTEX ST MATTA 3.5X16MM (Screw) ×4 IMPLANT
SCREW CORTEX ST MATTA 3.5X18MM (Screw) ×4 IMPLANT
SPLINT PLASTER CAST XFAST 5X30 (CAST SUPPLIES) IMPLANT
SPLINT PLASTER XFAST SET 5X30 (CAST SUPPLIES) ×4
SPONGE LAP 18X18 X RAY DECT (DISPOSABLE) ×3 IMPLANT
STRIP CLOSURE SKIN 1/2X4 (GAUZE/BANDAGES/DRESSINGS) ×1 IMPLANT
SUCTION FRAZIER TIP 10 FR DISP (SUCTIONS) ×3 IMPLANT
SUT ETHILON 3 0 PS 1 (SUTURE) ×6 IMPLANT
SUT MNCRL AB 4-0 PS2 18 (SUTURE) ×6 IMPLANT
SUT MON AB 2-0 CT1 27 (SUTURE) ×3 IMPLANT
SUT VIC AB 0 CT1 27 (SUTURE)
SUT VIC AB 0 CT1 27XBRD ANBCTR (SUTURE) IMPLANT
SYR BULB IRRIGATION 50ML (SYRINGE) ×3 IMPLANT
SYR CONTROL 10ML LL (SYRINGE) IMPLANT
TOWEL OR 17X24 6PK STRL BLUE (TOWEL DISPOSABLE) ×3 IMPLANT
TOWEL OR 17X26 10 PK STRL BLUE (TOWEL DISPOSABLE) ×3 IMPLANT
TUBE CONNECTING 12'X1/4 (SUCTIONS) ×1
TUBE CONNECTING 12X1/4 (SUCTIONS) ×2 IMPLANT
UNDERPAD 30X30 INCONTINENT (UNDERPADS AND DIAPERS) ×1 IMPLANT
WATER STERILE IRR 1000ML POUR (IV SOLUTION) ×1 IMPLANT
YANKAUER SUCT BULB TIP NO VENT (SUCTIONS) IMPLANT

## 2014-07-29 NOTE — Op Note (Signed)
07/29/2014  4:41 PM  PATIENT:  Kimberly Le    PRE-OPERATIVE DIAGNOSIS:  DISPLACED TRIMALLEOLAR FRACTURE OF UNSPECIFIED LOWER LEG INITIAL ENCOUNTER FOR CLOSED FRACTURE  LEFT ANKLE   POST-OPERATIVE DIAGNOSIS:  Same  PROCEDURE:  OPEN REDUCTION INTERNAL FIXATION (ORIF) LEFT  ANKLE FRACTURE  SURGEON:  Katalin Colledge, Ernesta Amble, MD  ASSISTANT: Lovett Calender, PA-C, She was present and scrubbed throughout the case, critical for completion in a timely fashion, and for retraction, instrumentation, and closure.   ANESTHESIA:   gen  PREOPERATIVE INDICATIONS:  Kimberly Le is a  64 y.o. female with a diagnosis of DISPLACED TRIMALLEOLAR FRACTURE OF UNSPECIFIED LOWER LEG INITIAL ENCOUNTER FOR CLOSED FRACTURE  LEFT ANKLE  who failed conservative measures and elected for surgical management.    The risks benefits and alternatives were discussed with the patient preoperatively including but not limited to the risks of infection, bleeding, nerve injury, cardiopulmonary complications, the need for revision surgery, among others, and the patient was willing to proceed.  OPERATIVE IMPLANTS: 1/3 tubular plate and canulated screws  OPERATIVE FINDINGS: Unstable ankle fracture. Stable syndesmosis post op  BLOOD LOSS: min  COMPLICATIONS: nonoe  TOURNIQUET TIME: 75 min  OPERATIVE PROCEDURE:  Patient was identified in the preoperative holding area and site was marked by me He was transported to the operating theater and placed on the table in supine position taking care to pad all bony prominences. After a preincinduction time out anesthesia was induced. The left lower lower extremity was prepped and draped in normal sterile fashion and a pre-incision timeout was performed. Kimberly Le received ancef for preoperative antibiotics.   I made a lateral incision of roughly 7 cm dissection was carried down sharply to the distal fibula and then spreading dissection was used proximally to protect the superficial  peroneal nerve. I sharply incised the periosteum and took care to protect the peroneal tendons. I then debrided the fracture site and performed a reduction maneuver which was held in place with a clamp. She had a lot of comminution  I placed a lag screw across the fracture  I then selected a 8-hole one third tubular plate and placed in a neutralization fashion care was taken distally so as not to penetrate the joint with the cancellus screws.  I then turned my attention medially where I created a 4 cm incision and dissected sharply down to the medial Mal fracture taking care to protect the saphenous vein. I debrided the fracture and reduced and held in place with a tenaculum. I then drilled and placed 2 partially threaded 45 mm cannulated screws one anterior and one posterior across the fracture. There was severe comminution at the medial side allowing for some fragmentation the large piece with the deltoid ligament was stabilized but there was comminution on both the joint side and the skin side that I had to remove to protect her joint.  I then stressed the syndesmosis and it was stable   I assesed the posterior mal piece and her ankle was stable unstable posteriorly so elected to fix the posterior Mal piece I reduced it with a joker and placed a cannulated screw from front to back protecting the neurovascular structures.  The wound was then thoroughly irrigated and closed using a 0 Vicryl and absorbable Monocryl sutures. He was placed in a short leg splint.   POST OPERATIVE PLAN: Non-weightbearing. DVT prophylaxis will consist of ASA 325  This note was generated using a template and dragon dictation system. In light of that, I  have reviewed the note and all aspects of it are applicable to this case. Any dictation errors are due to the computerized dictation system.

## 2014-07-29 NOTE — Transfer of Care (Signed)
Immediate Anesthesia Transfer of Care Note  Patient: Kimberly Le  Procedure(s) Performed: Procedure(s): OPEN REDUCTION INTERNAL FIXATION (ORIF) LEFT  ANKLE FRACTURE (Left)  Patient Location: PACU  Anesthesia Type:General  Level of Consciousness: awake, alert  and oriented  Airway & Oxygen Therapy: Patient Spontanous Breathing and Patient connected to nasal cannula oxygen  Post-op Assessment: Report given to RN and Post -op Vital signs reviewed and stable  Post vital signs: Reviewed and stable  Last Vitals:  Filed Vitals:   07/29/14 1418  BP:   Pulse: 74  Temp:   Resp: 22    Complications: No apparent anesthesia complications

## 2014-07-29 NOTE — H&P (Signed)
PREOPERATIVE H&P  Chief Complaint: DISPLACED TRIMALLEOLAR FRACTURE OF UNSPECIFIED LOWER LEG INITIAL ENCOUNTER FOR CLOSED FRACTURE  LEFT ANKLE   HPI: Kimberly Le is a 64 y.o. female who presents for preoperative history and physical with a diagnosis of DISPLACED TRIMALLEOLAR FRACTURE OF UNSPECIFIED LOWER LEG INITIAL ENCOUNTER FOR CLOSED FRACTURE  LEFT ANKLE . Symptoms are rated as moderate to severe, and have been worsening.  This is significantly impairing activities of daily living.  She has elected for surgical management.   Past Medical History  Diagnosis Date  . Allergy     seasonal   Past Surgical History  Procedure Laterality Date  . Hernia repair    . Dental surgery     History   Social History  . Marital Status: Married    Spouse Name: N/A  . Number of Children: N/A  . Years of Education: N/A   Social History Main Topics  . Smoking status: Current Every Day Smoker -- 0.25 packs/day  . Smokeless tobacco: Never Used  . Alcohol Use: Yes     Comment: social  . Drug Use: No  . Sexual Activity: Not on file   Other Topics Concern  . Not on file   Social History Narrative   Family History  Problem Relation Age of Onset  . Colon cancer Neg Hx   . Esophageal cancer Neg Hx   . Rectal cancer Neg Hx   . Stomach cancer Neg Hx    No Known Allergies Prior to Admission medications   Medication Sig Start Date End Date Taking? Authorizing Provider  ALPRAZolam (XANAX) 0.25 MG tablet Take 0.25 mg by mouth 2 (two) times daily as needed for anxiety.  06/23/14   Historical Provider, MD  buPROPion (WELLBUTRIN XL) 150 MG 24 hr tablet Take 150 mg by mouth daily.    Historical Provider, MD  Multiple Vitamins-Minerals (CENTRUM PO) Take 1 tablet by mouth daily.     Historical Provider, MD  ondansetron (ZOFRAN ODT) 8 MG disintegrating tablet Take 1 tablet (8 mg total) by mouth every 8 (eight) hours as needed for nausea or vomiting. 07/28/14   Lajean Saver, MD  oxyCODONE-acetaminophen  (PERCOCET/ROXICET) 5-325 MG per tablet Take 1-2 tablets by mouth every 6 (six) hours as needed for severe pain. 07/28/14   Lajean Saver, MD  peg 3350 powder (MOVIPREP) 100 G SOLR Moviprep as directed, no substitutions Patient not taking: Reported on 07/28/2014 05/07/12   Jerene Bears, MD  venlafaxine (EFFEXOR) 75 MG tablet Take 75 mg by mouth daily.    Historical Provider, MD     Positive ROS: All other systems have been reviewed and were otherwise negative with the exception of those mentioned in the HPI and as above.  Physical Exam: General: Alert, no acute distress Cardiovascular: No pedal edema Respiratory: No cyanosis, no use of accessory musculature GI: No organomegaly, abdomen is soft and non-tender Skin: No lesions in the area of chief complaint Neurologic: Sensation intact distally Psychiatric: Patient is competent for consent with normal mood and affect Lymphatic: No axillary or cervical lymphadenopathy  MUSCULOSKELETAL: Swelling and ecchymosis of the left ankle.  Diffusely tender to palpation.  Sensation intact with 2+ distal pulses.  Assessment: DISPLACED TRIMALLEOLAR FRACTURE OF UNSPECIFIED LOWER LEG INITIAL ENCOUNTER FOR CLOSED FRACTURE  LEFT ANKLE   Plan: Plan for Procedure(s): OPEN REDUCTION INTERNAL FIXATION (ORIF) LEFT  ANKLE FRACTURE  The risks benefits and alternatives were discussed with the patient including but not limited to the risks of nonoperative treatment, versus  surgical intervention including infection, bleeding, nerve injury,  blood clots, cardiopulmonary complications, morbidity, mortality, among others, and they were willing to proceed.   Gae Dry, PA-C  07/29/2014 11:34 AM

## 2014-07-29 NOTE — Discharge Instructions (Signed)
Keep splint clean and dry till follow up   Elevate as much as possible to help control swelling  ASA 325mg  daily for DVT prophylaxis for 3 weeks  Non-weight bearing in the Left leg

## 2014-07-29 NOTE — Anesthesia Preprocedure Evaluation (Addendum)
Anesthesia Evaluation  Patient identified by MRN, date of birth, ID band Patient awake and Patient confused    Reviewed: Allergy & Precautions, NPO status , Patient's Chart, lab work & pertinent test results  Airway Mallampati: II  TM Distance: >3 FB Neck ROM: Full    Dental  (+) Teeth Intact, Dental Advisory Given   Pulmonary Current Smoker,  breath sounds clear to auscultation        Cardiovascular Rhythm:Regular Rate:Normal     Neuro/Psych    GI/Hepatic   Endo/Other    Renal/GU      Musculoskeletal   Abdominal   Peds  Hematology   Anesthesia Other Findings   Reproductive/Obstetrics                           Anesthesia Physical Anesthesia Plan  ASA: II  Anesthesia Plan: General   Post-op Pain Management:    Induction: Intravenous  Airway Management Planned: LMA  Additional Equipment:   Intra-op Plan:   Post-operative Plan:   Informed Consent: I have reviewed the patients History and Physical, chart, labs and discussed the procedure including the risks, benefits and alternatives for the proposed anesthesia with the patient or authorized representative who has indicated his/her understanding and acceptance.   Dental advisory given  Plan Discussed with: CRNA and Anesthesiologist  Anesthesia Plan Comments:         Anesthesia Quick Evaluation

## 2014-07-29 NOTE — Anesthesia Postprocedure Evaluation (Signed)
  Anesthesia Post-op Note  Patient: Kimberly Le  Procedure(s) Performed: Procedure(s): OPEN REDUCTION INTERNAL FIXATION (ORIF) LEFT  ANKLE FRACTURE (Left)  Patient Location: PACU  Anesthesia Type:General and GA combined with regional for post-op pain  Level of Consciousness: awake, alert  and oriented  Airway and Oxygen Therapy: Patient Spontanous Breathing and Patient connected to nasal cannula oxygen  Post-op Pain: none  Post-op Assessment: Post-op Vital signs reviewed, Patient's Cardiovascular Status Stable, Respiratory Function Stable, Patent Airway and Pain level controlled LLE Motor Response: Purposeful movement LLE Sensation: Full sensation          Post-op Vital Signs: stable  Last Vitals:  Filed Vitals:   07/29/14 1745  BP: 131/71  Pulse: 84  Temp:   Resp: 16    Complications: No apparent anesthesia complications

## 2014-07-29 NOTE — Anesthesia Procedure Notes (Addendum)
Procedure Name: LMA Insertion Date/Time: 07/29/2014 2:57 PM Performed by: Merdis Delay Pre-anesthesia Checklist: Patient identified, Emergency Drugs available, Suction available, Patient being monitored and Timeout performed Patient Re-evaluated:Patient Re-evaluated prior to inductionOxygen Delivery Method: Circle system utilized Preoxygenation: Pre-oxygenation with 100% oxygen Intubation Type: IV induction LMA: LMA flexible inserted LMA Size: 4.0 Number of attempts: 1 Placement Confirmation: positive ETCO2,  CO2 detector and breath sounds checked- equal and bilateral Tube secured with: Tape Dental Injury: Teeth and Oropharynx as per pre-operative assessment    Anesthesia Regional Block:  Popliteal block  Pre-Anesthetic Checklist: ,, timeout performed, Correct Patient, Correct Site, Correct Laterality, Correct Procedure, Correct Position, site marked, Risks and benefits discussed,  Surgical consent,  Pre-op evaluation,  At surgeon's request and post-op pain management  Laterality: Left  Prep: chloraprep       Needles:  Injection technique: Single-shot  Needle Type: Echogenic Stimulator Needle     Needle Length: 9cm 9 cm Needle Gauge: 22 and 22 G    Additional Needles:  Procedures: ultrasound guided (picture in chart) Popliteal block Narrative:  Start time: 07/29/2014 2:40 PM End time: 07/29/2014 2:45 PM Injection made incrementally with aspirations every 5 mL.  Performed by: Personally   Additional Notes: 30 cc 0.5% bupivacaine 1:200 epi injected easily.

## 2014-07-30 DIAGNOSIS — S82852A Displaced trimalleolar fracture of left lower leg, initial encounter for closed fracture: Secondary | ICD-10-CM | POA: Diagnosis not present

## 2014-07-30 MED ORDER — OXYCODONE HCL 5 MG PO TABS: 5.0000 mg | ORAL_TABLET | Freq: Four times a day (QID) | ORAL | Status: DC | PRN

## 2014-07-30 MED ORDER — OXYCODONE-ACETAMINOPHEN 5-325 MG PO TABS: 1.0000 | ORAL_TABLET | ORAL | Status: DC | PRN

## 2014-07-30 NOTE — Progress Notes (Signed)
[facility]  POD 1  Subjective: Feeling well with good pain control but has not worked with PT yet today.  Objective:  Physical Exam Toes with brisk CR and able flex and extend great and lesser toes; edema well controlled; intact sens  Assessment/Plan: D/c to home post PT today NWB LLE Husband at home to assist Ice, elevation Oxycodone  Kimberly [place], MD [facility] [phone] (p)

## 2014-07-31 NOTE — Progress Notes (Signed)
D/C teaching completed. Rx's given to patient. Taken by Nursing Tech via w/c to [employer] exit with husband and personal belongings.

## 2014-08-02 ENCOUNTER — Encounter (HOSPITAL_COMMUNITY): Payer: Self-pay | Admitting: Orthopedic Surgery

## 2014-08-05 NOTE — Discharge Summary (Signed)
Physician Discharge Summary  Patient ID: Laquan Ludden MRN: [id number] DOB/AGE: 1950/12/09 64 y.o.  Admit date: 07/29/2014 Discharge date: 08/05/2014  Admission Diagnoses:  <principal problem not specified>  Discharge Diagnoses:  Active Problems:   Ankle fracture   Past Medical History  Diagnosis Date  . Allergy     seasonal  . History of pneumonia   . Headache     Surgeries: Procedure(s): OPEN REDUCTION INTERNAL FIXATION (ORIF) LEFT  ANKLE FRACTURE on 07/29/2014   Consultants (if any):    Discharged Condition: Improved  Hospital Course: Tessy Pawelski is an 64 y.o. female who was admitted 07/29/2014 with a diagnosis of <principal problem not specified> and went to the operating room on 07/29/2014 and underwent the above named procedures.    She was given perioperative antibiotics:  Anti-infectives    Start     Dose/Rate Route Frequency Ordered Stop   07/29/14 2030  ceFAZolin (ANCEF) IVPB 1 g/50 mL premix     1 g 100 mL/hr over 30 Minutes Intravenous Every 6 hours 07/29/14 1902 07/30/14 0932   07/29/14 1315  ceFAZolin (ANCEF) IVPB 2 g/50 mL premix     2 g 100 mL/hr over 30 Minutes Intravenous On call to O.R. 07/29/14 1255 07/29/14 1443    .  She was given sequential compression devices, early ambulation, and ASA 325mg  for DVT prophylaxis.  She benefited maximally from the hospital stay and there were no complications.    Recent vital signs:  Filed Vitals:   07/30/14 0521  BP: 119/61  Pulse: 84  Temp: 98.6 F (37 C)  Resp:     Recent laboratory studies:  Lab Results  Component Value Date   HGB 12.4 07/29/2014   HGB 15.2* 03/20/2008   Lab Results  Component Value Date   WBC 7.3 07/29/2014   PLT 218 07/29/2014   No results found for: INR Lab Results  Component Value Date   NA 138 07/29/2014   K 3.8 07/29/2014   CL 102 07/29/2014   CO2 28 07/29/2014   BUN 11 07/29/2014   CREATININE 0.78 07/29/2014   GLUCOSE 95 07/29/2014    Discharge Medications:      Medication List    STOP taking these medications        ibuprofen 200 MG tablet  Commonly known as:  ADVIL,MOTRIN     ondansetron 8 MG disintegrating tablet  Commonly known as:  ZOFRAN ODT      TAKE these medications        ALPRAZolam 0.25 MG tablet  Commonly known as:  XANAX  Take 0.0625-0.125 mg by mouth daily as needed for anxiety. 1/4 - 1/2 tablet     aspirin 325 MG tablet  Take 1 tablet (325 mg total) by mouth daily.     buPROPion 150 MG 24 hr tablet  Commonly known as:  WELLBUTRIN XL  Take 150 mg by mouth daily.     docusate sodium 100 MG capsule  Commonly known as:  COLACE  Take 1 capsule (100 mg total) by mouth 2 (two) times daily.     multivitamin with minerals Tabs tablet  Take 1 tablet by mouth daily. Centrum     ondansetron 4 MG tablet  Commonly known as:  ZOFRAN  Take 1 tablet (4 mg total) by mouth every 8 (eight) hours as needed for nausea or vomiting.     oxyCODONE 5 MG immediate release tablet  Commonly known as:  Oxy IR/ROXICODONE  Take 1-2 tablets (5-10 mg total) by  mouth every 6 (six) hours as needed for breakthrough pain (take between percocet for breakthrough pain).     oxyCODONE-acetaminophen 5-325 MG per tablet  Commonly known as:  PERCOCET  Take 1-2 tablets by mouth every 4 (four) hours as needed for severe pain.     venlafaxine XR 75 MG 24 hr capsule  Commonly known as:  EFFEXOR-XR  Take 75 mg by mouth daily.        Diagnostic Studies: Dg Ankle Complete Left  2014-08-10   CLINICAL DATA:  Post reduction.  Recent fall.  EXAM: LEFT ANKLE COMPLETE - 3+ VIEW  COMPARISON:  Radiograph earlier today.  FINDINGS: Improved position alignment of trimalleolar fracture and posterior dislocation. There is still significant disruption of the ankle mortise, and anterior displacement of the tibia on the talus. Posterior splint satisfactory position.  IMPRESSION: As above.   Electronically Signed   By: Staci Righter M.D.   On: August 10, 2014 12:47   Dg  Ankle Complete Left  August 10, 2014   CLINICAL DATA:  64 year old female with a history of fall and injury.  EXAM: LEFT ANKLE COMPLETE - 3+ VIEW  COMPARISON:  None.  FINDINGS: Fracture dislocation at the ankle.  Comminuted fracture of the distal fibular diaphysis, above the ankle mortise, with 23 degrees of angulation anteriorly.  Comminuted fracture of the distal tibia, involving the posterior malleolus and the medial malleolus. There has been posterior dislocation of the talus from the ankle mortise.  IMPRESSION: Fracture dislocation of the ankle, with posterior dislocation of the talus.  Comminuted fracture of the distal tibia involving the medial malleolus and the posterior malleolus. There is also comminuted fracture of the distal fibula, with approximately 23 degrees of anterior angulation at the fracture site  Signed,  Dulcy Fanny. Earleen Newport, DO  [facility]  [facility]   Electronically Signed   By: Corrie Mckusick D.O.   On: Aug 10, 2014 11:29   Dg Ankle Left Port  07/29/2014   CLINICAL DATA:  Trimalleolar fracture.  EXAM: PORTABLE LEFT ANKLE - 2 VIEW  COMPARISON:  Radiographs dated 08-10-2014  FINDINGS: The patient has undergone open reduction internal fixation of the try malleolar fracture. Multiple screws are in place as well as a sideplate on the distal fibula. Alignment and position of the multiple fracture fragments is substantially improved. No residual subluxation or dislocation.  IMPRESSION: Open reduction and internal fixation of trimalleolar fracture.   Electronically Signed   By: [name] M.D.   On: 07/29/2014 17:48    Disposition: 01-Home or Self Care      Discharge Instructions    Non weight bearing    Complete by:  As directed   Laterality:  left  Extremity:  Lower           Follow-up Information    Follow up with MURPHY, TIMOTHY D, MD In 1 week.   Specialty:  Orthopedic Surgery   Contact information:   [address]  [place] [address] [phone]        Signed: Gae Dry 08/05/2014, 9:38 AM Cell [phone]

## 2014-11-08 ENCOUNTER — Other Ambulatory Visit (HOSPITAL_COMMUNITY): Payer: Self-pay | Admitting: Orthopedic Surgery

## 2014-11-30 ENCOUNTER — Encounter (HOSPITAL_COMMUNITY)
Admission: RE | Admit: 2014-11-30 | Discharge: 2014-11-30 | Disposition: A | Discharge status: Home / Self Care | Payer: BLUE CROSS/BLUE SHIELD | Source: Ambulatory Visit | Attending: Orthopedic Surgery | Admitting: Orthopedic Surgery

## 2014-11-30 ENCOUNTER — Encounter (HOSPITAL_COMMUNITY): Payer: Self-pay

## 2014-11-30 DIAGNOSIS — X58XXXD Exposure to other specified factors, subsequent encounter: Secondary | ICD-10-CM | POA: Insufficient documentation

## 2014-11-30 DIAGNOSIS — S82892K Other fracture of left lower leg, subsequent encounter for closed fracture with nonunion: Secondary | ICD-10-CM | POA: Diagnosis not present

## 2014-11-30 DIAGNOSIS — Z01812 Encounter for preprocedural laboratory examination: Secondary | ICD-10-CM | POA: Insufficient documentation

## 2014-11-30 LAB — CBC
HCT: 43.2 % (ref 36.0–46.0)
Hemoglobin: 14.3 g/dL (ref 12.0–15.0)
MCH: 30.9 pg (ref 26.0–34.0)
MCHC: 33.1 g/dL (ref 30.0–36.0)
MCV: 93.3 fL (ref 78.0–100.0)
PLATELETS: 280 10*3/uL (ref 150–400)
RBC: 4.63 MIL/uL (ref 3.87–5.11)
RDW: 14.3 % (ref 11.5–15.5)
WBC: 6.7 10*3/uL (ref 4.0–10.5)

## 2014-11-30 LAB — COMPREHENSIVE METABOLIC PANEL
ALBUMIN: 4.2 g/dL (ref 3.5–5.0)
ALT: 21 U/L (ref 14–54)
AST: 29 U/L (ref 15–41)
Alkaline Phosphatase: 74 U/L (ref 38–126)
Anion gap: 9 (ref 5–15)
BUN: 16 mg/dL (ref 6–20)
CHLORIDE: 101 mmol/L (ref 101–111)
CO2: 28 mmol/L (ref 22–32)
CREATININE: 0.8 mg/dL (ref 0.44–1.00)
Calcium: 9.9 mg/dL (ref 8.9–10.3)
GFR calc non Af Amer: >60 mL/min (ref >60)
Glucose, Bld: 108 mg/dL — ABNORMAL HIGH (ref 65–99)
Potassium: 4.2 mmol/L (ref 3.5–5.1)
SODIUM: 138 mmol/L (ref 135–145)
Total Bilirubin: 0.7 mg/dL (ref 0.3–1.2)
Total Protein: 6.9 g/dL (ref 6.5–8.1)

## 2014-11-30 LAB — APTT: APTT: 33 s (ref 24–37)

## 2014-11-30 LAB — SURGICAL PCR SCREEN
MRSA, PCR: NEGATIVE
STAPHYLOCOCCUS AUREUS: NEGATIVE

## 2014-11-30 LAB — PROTIME-INR
INR: 0.97 (ref 0.00–1.49)
Prothrombin Time: 13.1 seconds (ref 11.6–15.2)

## 2014-11-30 NOTE — Pre-Procedure Instructions (Signed)
    Kimberly Le  11/30/2014      [id number] - [place], [place] - [address] [place] [address] Phone: [phone] Fax: [phone]    Your procedure is scheduled on 12-07-2014    Wednesday   Report to [facility] Admitting at 8:30 A.M.   Call this number if you have problems the morning of surgery:  [phone]   Remember:  Do not eat food or drink liquids after midnight.   Take these medicines the morning of surgery with A SIP OF WATER alprazolam(Xanax),Bupropion(Wellbutrin),pain medication if needed,zofran if needed.effexor   Do not wear jewelry, make-up or nail polish.  Do not wear lotions, powders, or perfumes.  You may not wear deodorant.  Do not shave 48 hours prior to surgery. .  Do not bring valuables to the hospital.  [facility] is not responsible for any belongings or valuables.  Contacts, dentures or bridgework may not be worn into surgery.  Leave your suitcase in the car.  After surgery it may be brought to your room.  For patients admitted to the hospital, discharge time will be determined by your treatment team.  Patients discharged the day of surgery will not be allowed to drive home.    Special instructions:  See attached Sheet for instructions on CHG shower  Please read over the following fact sheets that you were given. Pain Booklet, Coughing and Deep Breathing and Surgical Site Infection Prevention

## 2014-12-07 ENCOUNTER — Ambulatory Visit (HOSPITAL_COMMUNITY): Payer: BLUE CROSS/BLUE SHIELD | Admitting: Anesthesiology

## 2014-12-07 ENCOUNTER — Encounter (HOSPITAL_COMMUNITY)
Admission: RE | Disposition: A | Discharge status: Home / Self Care | Payer: Self-pay | Source: Ambulatory Visit | Attending: Orthopedic Surgery

## 2014-12-07 ENCOUNTER — Observation Stay (HOSPITAL_COMMUNITY)
Admission: RE | Admit: 2014-12-07 | Discharge: 2014-12-08 | Disposition: A | Discharge status: Home / Self Care | Payer: BLUE CROSS/BLUE SHIELD | Source: Ambulatory Visit | Attending: Orthopedic Surgery | Admitting: Orthopedic Surgery

## 2014-12-07 DIAGNOSIS — M12572 Traumatic arthropathy, left ankle and foot: Secondary | ICD-10-CM | POA: Insufficient documentation

## 2014-12-07 DIAGNOSIS — Z7982 Long term (current) use of aspirin: Secondary | ICD-10-CM | POA: Insufficient documentation

## 2014-12-07 DIAGNOSIS — F17200 Nicotine dependence, unspecified, uncomplicated: Secondary | ICD-10-CM | POA: Insufficient documentation

## 2014-12-07 DIAGNOSIS — Z981 Arthrodesis status: Secondary | ICD-10-CM

## 2014-12-07 DIAGNOSIS — M960 Pseudarthrosis after fusion or arthrodesis: Principal | ICD-10-CM | POA: Insufficient documentation

## 2014-12-07 DIAGNOSIS — S96812A Strain of other specified muscles and tendons at ankle and foot level, left foot, initial encounter: Secondary | ICD-10-CM | POA: Diagnosis not present

## 2014-12-07 DIAGNOSIS — X58XXXA Exposure to other specified factors, initial encounter: Secondary | ICD-10-CM | POA: Diagnosis not present

## 2014-12-07 HISTORY — PX: ANKLE FUSION: SHX5718

## 2014-12-07 SURGERY — ANKLE FUSION
Anesthesia: General | Site: Ankle | Laterality: Left

## 2014-12-07 MED ORDER — PROMETHAZINE HCL 25 MG/ML IJ SOLN: 6.2500 mg | INTRAMUSCULAR | Status: DC | PRN

## 2014-12-07 MED ORDER — MIDAZOLAM HCL 5 MG/5ML IJ SOLN
INTRAMUSCULAR | Status: DC | PRN
  Administered 2014-12-07: 2 mg via INTRAVENOUS

## 2014-12-07 MED ORDER — CHLORHEXIDINE GLUCONATE 4 % EX LIQD: 60.0000 mL | Freq: Once | CUTANEOUS | Status: DC

## 2014-12-07 MED ORDER — ASPIRIN EC 325 MG PO TBEC
325.0000 mg | DELAYED_RELEASE_TABLET | Freq: Every day | ORAL | Status: DC
  Administered 2014-12-08: 325 mg via ORAL
  Filled 2014-12-07 (×2): qty 1

## 2014-12-07 MED ORDER — ARTIFICIAL TEARS OP OINT
TOPICAL_OINTMENT | OPHTHALMIC | Status: AC
  Filled 2014-12-07: qty 3.5

## 2014-12-07 MED ORDER — BUPROPION HCL ER (XL) 150 MG PO TB24
150.0000 mg | ORAL_TABLET | Freq: Every day | ORAL | Status: DC
  Administered 2014-12-07: 150 mg via ORAL
  Filled 2014-12-07 (×3): qty 1

## 2014-12-07 MED ORDER — DEXAMETHASONE SODIUM PHOSPHATE 4 MG/ML IJ SOLN
INTRAMUSCULAR | Status: AC
  Filled 2014-12-07: qty 1

## 2014-12-07 MED ORDER — VENLAFAXINE HCL ER 75 MG PO CP24
75.0000 mg | ORAL_CAPSULE | Freq: Every day | ORAL | Status: DC
  Administered 2014-12-07: 75 mg via ORAL
  Filled 2014-12-07 (×2): qty 1

## 2014-12-07 MED ORDER — FENTANYL CITRATE (PF) 250 MCG/5ML IJ SOLN
INTRAMUSCULAR | Status: AC
  Filled 2014-12-07: qty 5

## 2014-12-07 MED ORDER — FENTANYL CITRATE (PF) 100 MCG/2ML IJ SOLN
25.0000 ug | INTRAMUSCULAR | Status: DC | PRN
  Administered 2014-12-07 (×2): 50 ug via INTRAVENOUS

## 2014-12-07 MED ORDER — ACETAMINOPHEN 650 MG RE SUPP: 650.0000 mg | Freq: Four times a day (QID) | RECTAL | Status: DC | PRN

## 2014-12-07 MED ORDER — PROPOFOL 10 MG/ML IV BOLUS
INTRAVENOUS | Status: AC
  Filled 2014-12-07: qty 20

## 2014-12-07 MED ORDER — LIDOCAINE HCL (CARDIAC) 20 MG/ML IV SOLN
INTRAVENOUS | Status: AC
  Filled 2014-12-07: qty 5

## 2014-12-07 MED ORDER — CEFAZOLIN SODIUM-DEXTROSE 2-3 GM-% IV SOLR
INTRAVENOUS | Status: AC
  Filled 2014-12-07: qty 50

## 2014-12-07 MED ORDER — ONDANSETRON HCL 4 MG PO TABS: 4.0000 mg | ORAL_TABLET | Freq: Four times a day (QID) | ORAL | Status: DC | PRN

## 2014-12-07 MED ORDER — SODIUM CHLORIDE 0.9 % IV SOLN
INTRAVENOUS | Status: DC
  Administered 2014-12-07: 13:00:00 via INTRAVENOUS

## 2014-12-07 MED ORDER — ONDANSETRON HCL 4 MG/2ML IJ SOLN
4.0000 mg | Freq: Four times a day (QID) | INTRAMUSCULAR | Status: DC | PRN
  Administered 2014-12-07: 4 mg via INTRAVENOUS
  Filled 2014-12-07: qty 2

## 2014-12-07 MED ORDER — SUCCINYLCHOLINE CHLORIDE 20 MG/ML IJ SOLN
INTRAMUSCULAR | Status: AC
  Filled 2014-12-07: qty 1

## 2014-12-07 MED ORDER — ALPRAZOLAM 0.5 MG PO TABS
0.5000 mg | ORAL_TABLET | Freq: Two times a day (BID) | ORAL | Status: DC | PRN
  Administered 2014-12-07 – 2014-12-08 (×2): 0.5 mg via ORAL
  Filled 2014-12-07 (×2): qty 1

## 2014-12-07 MED ORDER — HYDROMORPHONE HCL 1 MG/ML IJ SOLN
1.0000 mg | INTRAMUSCULAR | Status: DC | PRN
  Administered 2014-12-07: 1 mg via INTRAVENOUS
  Filled 2014-12-07: qty 1

## 2014-12-07 MED ORDER — ROCURONIUM BROMIDE 50 MG/5ML IV SOLN
INTRAVENOUS | Status: AC
  Filled 2014-12-07: qty 1

## 2014-12-07 MED ORDER — ALPRAZOLAM 0.25 MG PO TABS: 0.0625 mg | ORAL_TABLET | Freq: Every day | ORAL | Status: DC | PRN

## 2014-12-07 MED ORDER — ONDANSETRON HCL 4 MG/2ML IJ SOLN
INTRAMUSCULAR | Status: AC
  Filled 2014-12-07: qty 4

## 2014-12-07 MED ORDER — METHOCARBAMOL 500 MG PO TABS
500.0000 mg | ORAL_TABLET | Freq: Four times a day (QID) | ORAL | Status: DC | PRN
  Administered 2014-12-08: 500 mg via ORAL
  Filled 2014-12-07: qty 1

## 2014-12-07 MED ORDER — METOCLOPRAMIDE HCL 5 MG PO TABS
5.0000 mg | ORAL_TABLET | Freq: Three times a day (TID) | ORAL | Status: DC | PRN
Start: 2014-12-07 — End: 2014-12-08

## 2014-12-07 MED ORDER — CEFAZOLIN SODIUM 1-5 GM-% IV SOLN
1.0000 g | Freq: Four times a day (QID) | INTRAVENOUS | Status: AC
  Administered 2014-12-07 – 2014-12-08 (×3): 1 g via INTRAVENOUS
  Filled 2014-12-07 (×4): qty 50

## 2014-12-07 MED ORDER — OXYCODONE HCL 5 MG PO TABS
5.0000 mg | ORAL_TABLET | ORAL | Status: DC | PRN
  Administered 2014-12-07 – 2014-12-08 (×5): 10 mg via ORAL
  Filled 2014-12-07 (×5): qty 2

## 2014-12-07 MED ORDER — SODIUM CHLORIDE 0.9 % IJ SOLN
INTRAMUSCULAR | Status: AC
  Filled 2014-12-07: qty 10

## 2014-12-07 MED ORDER — FENTANYL CITRATE (PF) 250 MCG/5ML IJ SOLN
INTRAMUSCULAR | Status: DC | PRN
  Administered 2014-12-07: 100 ug via INTRAVENOUS
  Administered 2014-12-07: 50 ug via INTRAVENOUS
  Administered 2014-12-07: 100 ug via INTRAVENOUS

## 2014-12-07 MED ORDER — SODIUM CHLORIDE 0.9 % IR SOLN
Status: DC | PRN
  Administered 2014-12-07: 1

## 2014-12-07 MED ORDER — EPHEDRINE SULFATE 50 MG/ML IJ SOLN
INTRAMUSCULAR | Status: AC
  Filled 2014-12-07: qty 1

## 2014-12-07 MED ORDER — BUPIVACAINE-EPINEPHRINE (PF) 0.5% -1:200000 IJ SOLN
INTRAMUSCULAR | Status: DC | PRN
  Administered 2014-12-07: 40 mL via PERINEURAL

## 2014-12-07 MED ORDER — LACTATED RINGERS IV SOLN
INTRAVENOUS | Status: DC | PRN
  Administered 2014-12-07: 08:00:00 via INTRAVENOUS

## 2014-12-07 MED ORDER — LIDOCAINE HCL (CARDIAC) 20 MG/ML IV SOLN
INTRAVENOUS | Status: DC | PRN
  Administered 2014-12-07: 80 mg via INTRAVENOUS

## 2014-12-07 MED ORDER — PROPOFOL 10 MG/ML IV BOLUS
INTRAVENOUS | Status: DC | PRN
  Administered 2014-12-07: 150 mg via INTRAVENOUS

## 2014-12-07 MED ORDER — DIPHENHYDRAMINE HCL 50 MG/ML IJ SOLN
INTRAMUSCULAR | Status: AC
  Filled 2014-12-07: qty 1

## 2014-12-07 MED ORDER — FENTANYL CITRATE (PF) 100 MCG/2ML IJ SOLN
INTRAMUSCULAR | Status: AC
  Administered 2014-12-07: 50 ug via INTRAVENOUS
  Filled 2014-12-07: qty 2

## 2014-12-07 MED ORDER — ONDANSETRON HCL 4 MG/2ML IJ SOLN
INTRAMUSCULAR | Status: DC | PRN
  Administered 2014-12-07: 4 mg via INTRAVENOUS

## 2014-12-07 MED ORDER — MORPHINE SULFATE (PF) 2 MG/ML IV SOLN: 1.0000 mg | INTRAVENOUS | Status: DC | PRN

## 2014-12-07 MED ORDER — METOCLOPRAMIDE HCL 5 MG/ML IJ SOLN: 5.0000 mg | Freq: Three times a day (TID) | INTRAMUSCULAR | Status: DC | PRN

## 2014-12-07 MED ORDER — ACETAMINOPHEN 325 MG PO TABS: 650.0000 mg | ORAL_TABLET | Freq: Four times a day (QID) | ORAL | Status: DC | PRN

## 2014-12-07 MED ORDER — CEFAZOLIN SODIUM-DEXTROSE 2-3 GM-% IV SOLR
2.0000 g | INTRAVENOUS | Status: AC
  Administered 2014-12-07: 2 g via INTRAVENOUS

## 2014-12-07 MED ORDER — KETOROLAC TROMETHAMINE 30 MG/ML IJ SOLN
30.0000 mg | Freq: Four times a day (QID) | INTRAMUSCULAR | Status: DC | PRN
Start: 2014-12-07 — End: 2014-12-08
  Administered 2014-12-07: 30 mg via INTRAVENOUS
  Filled 2014-12-07: qty 1

## 2014-12-07 MED ORDER — METHOCARBAMOL 1000 MG/10ML IJ SOLN
500.0000 mg | Freq: Four times a day (QID) | INTRAVENOUS | Status: DC | PRN
  Filled 2014-12-07: qty 5

## 2014-12-07 MED ORDER — MIDAZOLAM HCL 2 MG/2ML IJ SOLN
INTRAMUSCULAR | Status: AC
  Filled 2014-12-07: qty 4

## 2014-12-07 SURGICAL SUPPLY — 48 items
BANDAGE ESMARK 6X9 LF (GAUZE/BANDAGES/DRESSINGS) ×1 IMPLANT
BIT DRILL CANN LRG QC 5X300 (BIT) ×2 IMPLANT
BLADE SAW SGTL HD 18.5X60.5X1. (BLADE) ×3 IMPLANT
BLADE SURG 10 STRL SS (BLADE) ×6 IMPLANT
BNDG CMPR 9X6 STRL LF SNTH (GAUZE/BANDAGES/DRESSINGS) ×1
BNDG COHESIVE 4X5 TAN STRL (GAUZE/BANDAGES/DRESSINGS) ×3 IMPLANT
BNDG ESMARK 6X9 LF (GAUZE/BANDAGES/DRESSINGS) ×3
BNDG GAUZE ELAST 4 BULKY (GAUZE/BANDAGES/DRESSINGS) ×6 IMPLANT
COVER MAYO STAND STRL (DRAPES) IMPLANT
COVER SURGICAL LIGHT HANDLE (MISCELLANEOUS) ×6 IMPLANT
DRAPE OEC MINIVIEW 54X84 (DRAPES) ×4 IMPLANT
DRAPE U-SHAPE 47X51 STRL (DRAPES) ×3 IMPLANT
DRSG ADAPTIC 3X8 NADH LF (GAUZE/BANDAGES/DRESSINGS) ×3 IMPLANT
DRSG PAD ABDOMINAL 8X10 ST (GAUZE/BANDAGES/DRESSINGS) ×8 IMPLANT
DURAPREP 26ML APPLICATOR (WOUND CARE) ×3 IMPLANT
ELECT REM PT RETURN 9FT ADLT (ELECTROSURGICAL) ×3
ELECTRODE REM PT RTRN 9FT ADLT (ELECTROSURGICAL) ×1 IMPLANT
GAUZE SPONGE 4X4 12PLY STRL (GAUZE/BANDAGES/DRESSINGS) ×3 IMPLANT
GLOVE BIOGEL PI IND STRL 9 (GLOVE) ×1 IMPLANT
GLOVE BIOGEL PI INDICATOR 9 (GLOVE) ×2
GLOVE SURG ORTHO 9.0 STRL STRW (GLOVE) ×3 IMPLANT
GOWN STRL REUS W/ TWL LRG LVL3 (GOWN DISPOSABLE) ×1 IMPLANT
GOWN STRL REUS W/ TWL XL LVL3 (GOWN DISPOSABLE) ×1 IMPLANT
GOWN STRL REUS W/TWL LRG LVL3 (GOWN DISPOSABLE) ×6
GOWN STRL REUS W/TWL XL LVL3 (GOWN DISPOSABLE) ×3
GUIDEWIRE THREADED 2.8 (WIRE) ×4 IMPLANT
KIT BASIN OR (CUSTOM PROCEDURE TRAY) ×3 IMPLANT
KIT ROOM TURNOVER OR (KITS) ×3 IMPLANT
NS IRRIG 1000ML POUR BTL (IV SOLUTION) ×3 IMPLANT
PACK ORTHO EXTREMITY (CUSTOM PROCEDURE TRAY) ×3 IMPLANT
PAD ARMBOARD 7.5X6 YLW CONV (MISCELLANEOUS) ×6 IMPLANT
PASSER SUT SWANSON 36MM LOOP (INSTRUMENTS) ×2 IMPLANT
SCREW COMPRESSION 6.5X45MM (Screw) ×2 IMPLANT
SCREW COMPRESSION 6.5X50MM (Screw) ×2 IMPLANT
SPONGE LAP 18X18 X RAY DECT (DISPOSABLE) ×3 IMPLANT
SUCTION FRAZIER TIP 10 FR DISP (SUCTIONS) ×3 IMPLANT
SUT ETHILON 2 0 PSLX (SUTURE) ×9 IMPLANT
SUT FIBERWIRE #2 38 T-5 BLUE (SUTURE) ×6
SUT FIBERWIRE 2-0 18 17.9 3/8 (SUTURE) ×6
SUT VIC AB 0 CTX 36 (SUTURE) ×3
SUT VIC AB 0 CTX36XBRD ANTBCTR (SUTURE) IMPLANT
SUTURE FIBERWR #2 38 T-5 BLUE (SUTURE) IMPLANT
SUTURE FIBERWR 2-0 18 17.9 3/8 (SUTURE) IMPLANT
TOWEL OR 17X24 6PK STRL BLUE (TOWEL DISPOSABLE) ×3 IMPLANT
TOWEL OR 17X26 10 PK STRL BLUE (TOWEL DISPOSABLE) ×3 IMPLANT
TUBE CONNECTING 12'X1/4 (SUCTIONS) ×1
TUBE CONNECTING 12X1/4 (SUCTIONS) ×2 IMPLANT
WATER STERILE IRR 1000ML POUR (IV SOLUTION) ×3 IMPLANT

## 2014-12-07 NOTE — Op Note (Signed)
12/07/2014  10:03 AM  PATIENT:  Kimberly Le    PRE-OPERATIVE DIAGNOSIS:  Traumatic Non-Union Left Ankle Fracture  POST-OPERATIVE DIAGNOSIS: #1 Traumatic nonunion left ankle fracture #2 traumatic rupture of the posterior tibial tendon #3 removal of deep retained hardware. #4 C-arm fluoroscopy for fusion  PROCEDURE:  #1 Removal Deep Hardware and Left Ankle Fusion #2 repair posterior tibial tendon #3 left ankle fusion  SURGEON:  Shaqueta Casady V, MD  PHYSICIAN ASSISTANT:None ANESTHESIA:   General  PREOPERATIVE INDICATIONS:  Kimberly Le is a  64 y.o. female with a diagnosis of Traumatic Non-Union Left Ankle Fracture who failed conservative measures and elected for surgical management.    The risks benefits and alternatives were discussed with the patient preoperatively including but not limited to the risks of infection, bleeding, nerve injury, cardiopulmonary complications, the need for revision surgery, among others, and the patient was willing to proceed.  OPERATIVE IMPLANTS: 6.5 Synthes headless screws 2  OPERATIVE FINDINGS: Patient had a traumatic rupture of the posterior tibial tendon  OPERATIVE PROCEDURE: Patient was brought to the operating room after a popliteal block she then underwent a general anesthetic. After adequate levels anesthesia were obtained agents left lower extremity was prepped using DuraPrep draped into a sterile field. A timeout was called. Medial and lateral incisions were made over previous surgical incisions to remove the failed deep retained hardware both medially and laterally. It was identified medially the patient had a traumatic rupture of the posterior tibial tendon this was not acute. The two ends were identified and Kessler suture technique was used with a #2 FiberWire to tack both ends of the tendon. Through the lateral incision the distal aspect the fibula was excised and the tibia and talar dome underwent a mastectomy with a oscillating saw  perpendicular to the long axis of the tibia. The tibial talar joint was reduced and aligned with a 90 ankle dorsiflexion neutral varus and valgus to the hindfoot. K wires were used to stabilize the construct. C-arm fluoroscopy was utilized to align the K wires. Using 6.5 headless cannulated screws these were then utilized to stabilize the tibial talar fusion. The 2 ends of the posterior tibial tendon were then reapproximated using the #2 FiberWire. This had a good stable construct. C-arm floss be again was utilized to verify the alignment. The wound was irrigated with normal saline medially and laterally. The incisions were closed using 2-0 nylon. A sterile compressive dressing was applied patient was extubated taken to the PACU in stable condition.

## 2014-12-07 NOTE — Anesthesia Preprocedure Evaluation (Addendum)
Anesthesia Evaluation  Patient identified by MRN, date of birth, ID band Patient awake and Patient confused    Reviewed: Allergy & Precautions, NPO status , Patient's Chart, lab work & pertinent test results  Airway Mallampati: II  TM Distance: >3 FB Neck ROM: Full    Dental  (+) Teeth Intact, Dental Advisory Given, Caps   Pulmonary Current Smoker,    Pulmonary exam normal breath sounds clear to auscultation       Cardiovascular Exercise Tolerance: Good negative cardio ROS Normal cardiovascular exam Rhythm:Regular Rate:Normal     Neuro/Psych  Headaches,    GI/Hepatic negative GI ROS, Neg liver ROS,   Endo/Other  negative endocrine ROS  Renal/GU negative Renal ROS     Musculoskeletal negative musculoskeletal ROS (+)   Abdominal   Peds  Hematology negative hematology ROS (+)   Anesthesia Other Findings Day of surgery medications reviewed with the patient.  Reproductive/Obstetrics                            Anesthesia Physical Anesthesia Plan  ASA: II  Anesthesia Plan: General   Post-op Pain Management: GA combined w/ Regional for post-op pain   Induction: Intravenous  Airway Management Planned: LMA  Additional Equipment:   Intra-op Plan:   Post-operative Plan: Extubation in OR  Informed Consent: I have reviewed the patients History and Physical, chart, labs and discussed the procedure including the risks, benefits and alternatives for the proposed anesthesia with the patient or authorized representative who has indicated his/her understanding and acceptance.   Dental advisory given  Plan Discussed with: CRNA  Anesthesia Plan Comments: (Risks/benefits of general anesthesia discussed with patient including risk of damage to teeth, lips, gum, and tongue, nausea/vomiting, allergic reactions to medications, and the possibility of heart attack, stroke and death.  All patient  questions answered.  Patient wishes to proceed.  GA + Popliteal/Saphenous nerve block)        Anesthesia Quick Evaluation

## 2014-12-07 NOTE — Transfer of Care (Signed)
Immediate Anesthesia Transfer of Care Note  Patient: Kimberly Le  Procedure(s) Performed: Procedure(s): Removal Deep Hardware and Left Ankle Fusion (Left)  Patient Location: PACU  Anesthesia Type:General  Level of Consciousness: awake, alert  and oriented  Airway & Oxygen Therapy: Patient Spontanous Breathing and Patient connected to nasal cannula oxygen  Post-op Assessment: Report given to RN, Post -op Vital signs reviewed and stable and Patient moving all extremities X 4  Post vital signs: Reviewed and stable  Last Vitals:  Filed Vitals:   12/07/14 1009  BP: 143/91  Pulse:   Temp: 35.9 C  Resp: 16    Complications: No apparent anesthesia complications

## 2014-12-07 NOTE — Anesthesia Postprocedure Evaluation (Signed)
  Anesthesia Post-op Note  Patient: Kimberly Le  Procedure(s) Performed: Procedure(s) (LRB): Removal Deep Hardware and Left Ankle Fusion (Left)  Patient Location: PACU  Anesthesia Type: GA combined with regional for post-op pain  Level of Consciousness: awake and alert   Airway and Oxygen Therapy: Patient Spontanous Breathing  Post-op Pain: mild  Post-op Assessment: Post-op Vital signs reviewed, Patient's Cardiovascular Status Stable, Respiratory Function Stable, Patent Airway and No signs of Nausea or vomiting  Last Vitals:  Filed Vitals:   12/07/14 1145  BP:   Pulse: 94  Temp:   Resp: 20    Post-op Vital Signs: stable   Complications: No apparent anesthesia complications

## 2014-12-07 NOTE — H&P (Signed)
Kimberly Le is an 64 y.o. female.   Chief Complaint: Traumatic arthritis left ankle HPI: Kimberly Le is a 64 year old woman who is status post open reduction internal fixation of her left ankle. Kimberly Le has had progressive traumatic collapse of the joint surface and presents at this time for removal of deep retained hardware and fusion of the ankle.  Past Medical History  Diagnosis Date  . Allergy     seasonal  . History of pneumonia   . Headache     Past Surgical History  Procedure Laterality Date  . Hernia repair    . Dental surgery    . Orif ankle fracture Left 07/29/2014    Procedure: OPEN REDUCTION INTERNAL FIXATION (ORIF) LEFT  ANKLE FRACTURE;  Surgeon: Renette Butters, MD;  Location: [place];  Service: Orthopedics;  Laterality: Left;    Family History  Problem Relation Age of Onset  . Colon cancer Neg Hx   . Esophageal cancer Neg Hx   . Rectal cancer Neg Hx   . Stomach cancer Neg Hx    Social History:  reports that she has been smoking.  She has never used smokeless tobacco. She reports that she drinks alcohol. She reports that she does not use illicit drugs.  Allergies: No Known Allergies  Medications Prior to Admission  Medication Sig Dispense Refill  . ALPRAZolam (XANAX) 0.25 MG tablet Take 0.0625-0.125 mg by mouth daily as needed for anxiety. 1/4 - 1/2 tablet  0  . aspirin 81 MG tablet Take 81 mg by mouth daily.    Marland Kitchen buPROPion (WELLBUTRIN XL) 150 MG 24 hr tablet Take 150 mg by mouth daily.    Marland Kitchen docusate sodium (COLACE) 100 MG capsule Take 1 capsule (100 mg total) by mouth 2 (two) times daily. (Kimberly Le taking differently: Take 100 mg by mouth 2 (two) times daily as needed for mild constipation. ) 10 capsule 0  . HYDROcodone-acetaminophen (NORCO/VICODIN) 5-325 MG tablet Take 1 tablet by mouth every 8 (eight) hours as needed for moderate pain.    . Multiple Vitamin (MULTIVITAMIN WITH MINERALS) TABS tablet Take 1 tablet by mouth daily. Centrum    . ondansetron (ZOFRAN) 4 MG  tablet Take 1 tablet (4 mg total) by mouth every 8 (eight) hours as needed for nausea or vomiting. 20 tablet 0  . venlafaxine XR (EFFEXOR-XR) 75 MG 24 hr capsule Take 75 mg by mouth daily.      No results found for this or any previous visit (from the past 48 hour(s)). No results found.  Review of Systems  All other systems reviewed and are negative.   There were no vitals taken for this visit. Physical Exam  On examination Kimberly Le has palpable pulses. 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 Kimberly Le has pain to palpation around the ankle. Assessment/Plan Assessment: Traumatic arthritis left ankle status post open reduction internal fixation.  Plan: We'll plan for removal deep retained hardware and ankle fusion. Risks and benefits were discussed Kimberly Le states she understands and wishes to proceed at this time.  [name] V 12/07/2014, 6:32 AM

## 2014-12-07 NOTE — Anesthesia Procedure Notes (Addendum)
Procedure Name: LMA Insertion Date/Time: 12/07/2014 8:41 AM Performed by: Mariea Clonts Pre-anesthesia Checklist: Patient identified, Timeout performed, Emergency Drugs available, Suction available and Patient being monitored Patient Re-evaluated:Patient Re-evaluated prior to inductionOxygen Delivery Method: Circle system utilized Preoxygenation: Pre-oxygenation with 100% oxygen Intubation Type: IV induction LMA: LMA inserted LMA Size: 4.0 Number of attempts: 1 Placement Confirmation: positive ETCO2 and breath sounds checked- equal and bilateral Tube secured with: Tape Dental Injury: Teeth and Oropharynx as per pre-operative assessment     Anesthesia Regional Block:  Adductor canal block  Pre-Anesthetic Checklist: ,, timeout performed, Correct Patient, Correct Site, Correct Laterality, Correct Procedure, Correct Position, site marked, Risks and benefits discussed,  Surgical consent,  Pre-op evaluation,  At surgeon's request and post-op pain management  Laterality: Left  Prep: chloraprep       Needles:  Injection technique: Single-shot  Needle Type: Echogenic Needle     Needle Length: 9cm 9 cm Needle Gauge: 21 and 21 G    Additional Needles:  Procedures: ultrasound guided (picture in chart) Adductor canal block Narrative:  Injection made incrementally with aspirations every 5 mL.  Performed by: Personally  Anesthesiologist: Catalina Gravel  Additional Notes: No pain on injection. No increased resistance to injection. Injection made in 5cc increments.  Good needle visualization.  Patient tolerated procedure well.   Anesthesia Regional Block:  Popliteal block  Pre-Anesthetic Checklist: ,, timeout performed, Correct Patient, Correct Site, Correct Laterality, Correct Procedure, Correct Position, site marked, Risks and benefits discussed,  Surgical consent,  Pre-op evaluation,  At surgeon's request and post-op pain management  Laterality: Left  Prep:  chloraprep       Needles:  Injection technique: Single-shot  Needle Type: Echogenic Needle     Needle Length: 9cm 9 cm Needle Gauge: 21 and 21 G    Additional Needles:  Procedures: ultrasound guided (picture in chart) Popliteal block Narrative:  Injection made incrementally with aspirations every 5 mL.  Performed by: Personally  Anesthesiologist: Catalina Gravel  Additional Notes: No pain on injection. No increased resistance to injection. Injection made in 5cc increments.  Good needle visualization.  Patient tolerated procedure well.

## 2014-12-08 ENCOUNTER — Encounter (HOSPITAL_COMMUNITY): Payer: Self-pay | Admitting: Orthopedic Surgery

## 2014-12-08 DIAGNOSIS — M960 Pseudarthrosis after fusion or arthrodesis: Secondary | ICD-10-CM | POA: Diagnosis not present

## 2014-12-08 MED ORDER — ALPRAZOLAM 0.25 MG PO TABS: 0.2500 mg | ORAL_TABLET | Freq: Three times a day (TID) | ORAL | Status: AC | PRN

## 2014-12-08 MED ORDER — ASPIRIN EC 325 MG PO TBEC: 325.0000 mg | DELAYED_RELEASE_TABLET | Freq: Every day | ORAL | Status: AC

## 2014-12-08 MED ORDER — OXYCODONE-ACETAMINOPHEN 5-325 MG PO TABS: 1.0000 | ORAL_TABLET | ORAL | Status: AC | PRN

## 2014-12-08 NOTE — Discharge Summary (Signed)
Physician Discharge Summary  Patient ID: Kimberly Le MRN: [id number] DOB/AGE: 10-16-1950 64 y.o.  Admit date: 12/07/2014 Discharge date: 12/08/2014  Admission Diagnoses: Traumatic arthritis left ankle status post open reduction internal fixation.  Discharge Diagnoses:  Active Problems:   S/P ankle fusion   Discharged Condition: stable  Hospital Course: Patient's hospital course was essentially unremarkable. She underwent takedown of the nonunion of the ankle fracture and fusion of the ankle. Postoperatively patient progressed well with discharge to home in stable condition.  Consults: None  Significant Diagnostic Studies: labs: Routine labs  Treatments: surgery: See operative note  Discharge Exam: Blood pressure 94/50, pulse 91, temperature 98.9 F (37.2 C), temperature source Oral, resp. rate 20, weight 65.318 kg (144 lb), SpO2 92 %. Incision/Wound: dressing clean and dry  Disposition: 01-Home or Self Care  Discharge Instructions    Call MD / Call 911    Complete by:  As directed   If you experience chest pain or shortness of breath, CALL 911 and be transported to the hospital emergency room.  If you develope a fever above 101 F, pus (white drainage) or increased drainage or redness at the wound, or calf pain, call your surgeon's office.     Constipation Prevention    Complete by:  As directed   Drink plenty of fluids.  Prune juice may be helpful.  You may use a stool softener, such as Colace (over the counter) 100 mg twice a day.  Use MiraLax (over the counter) for constipation as needed.     Diet - low sodium heart healthy    Complete by:  As directed      Elevate operative extremity    Complete by:  As directed      Increase activity slowly as tolerated    Complete by:  As directed      Non weight bearing    Complete by:  As directed   Laterality:  left  Extremity:  Lower            Medication List    TAKE these medications        ALPRAZolam 0.25 MG  tablet  Commonly known as:  XANAX  Take 0.0625-0.125 mg by mouth daily as needed for anxiety. 1/4 - 1/2 tablet     ALPRAZolam 0.25 MG tablet  Commonly known as:  XANAX  Take 1 tablet (0.25 mg total) by mouth 3 (three) times daily as needed for anxiety.     aspirin 81 MG tablet  Take 81 mg by mouth daily.     aspirin EC 325 MG tablet  Take 1 tablet (325 mg total) by mouth daily.     buPROPion 150 MG 24 hr tablet  Commonly known as:  WELLBUTRIN XL  Take 150 mg by mouth daily.     docusate sodium 100 MG capsule  Commonly known as:  COLACE  Take 1 capsule (100 mg total) by mouth 2 (two) times daily.     HYDROcodone-acetaminophen 5-325 MG tablet  Commonly known as:  NORCO/VICODIN  Take 1 tablet by mouth every 8 (eight) hours as needed for moderate pain.     multivitamin with minerals Tabs tablet  Take 1 tablet by mouth daily. Centrum     ondansetron 4 MG tablet  Commonly known as:  ZOFRAN  Take 1 tablet (4 mg total) by mouth every 8 (eight) hours as needed for nausea or vomiting.     oxyCODONE-acetaminophen 5-325 MG tablet  Commonly known as:  ROXICET  Take 1 tablet by mouth every 4 (four) hours as needed for severe pain.     venlafaxine XR 75 MG 24 hr capsule  Commonly known as:  EFFEXOR-XR  Take 75 mg by mouth daily.           Follow-up Information    Follow up with Alford Gamero V, MD In 1 week.   Specialty:  Orthopedic Surgery   Contact information:   [place] [address] [phone]       Signed: Newt Minion 12/08/2014, 6:40 AM

## 2014-12-08 NOTE — Progress Notes (Signed)
Kimberly Le discharged home per MD order. Discharge instructions reviewed and discussed with patient. All questions and concerns answered. Copy of instructions and scripts given to patient. IV removed.  Patient escorted to car by staff in a wheelchair. No distress noted upon discharge.   Tarri Abernethy R 12/08/2014 12:01 PM

## 2014-12-08 NOTE — Evaluation (Signed)
Physical Therapy Evaluation Patient Details Name: Kimberly Le MRN: [id number] DOB: Jun 22, 1950 Today's Date: 12/08/2014   History of Present Illness  64 y.o. female s/p hardware removal and fusion L ankle.  Clinical Impression  PT eval complete. Pt instructed in general UE exercises for strengthening, as well as strengthening exercises for RLE. She was instructed in ROM exercises, without addition of weight and without WB, for L knee/hip. Pt will have needed level of assist at home from her spouse. She has all needed home equipment. Plan is for d/c home today. PT signing off.    Follow Up Recommendations No PT follow up;Supervision for mobility/OOB    Equipment Recommendations  None recommended by PT    Recommendations for Other Services       Precautions / Restrictions Precautions Precautions: Fall Restrictions Weight Bearing Restrictions: Yes LLE Weight Bearing: Non weight bearing      Mobility  Bed Mobility Overal bed mobility: Modified Independent                Transfers Overall transfer level: Needs assistance Equipment used: Ambulation equipment used Transfers: Sit to/from [facility] Sit to Stand: Min guard Stand pivot transfers: Min guard       General transfer comment: Pt demo good technique. Min guard assist provided for safety.  Ambulation/Gait Ambulation/Gait assistance: Min guard Ambulation Distance (Feet): 3 Feet Assistive device: Rolling walker (2 wheeled) Gait Pattern/deviations: Step-to pattern Gait velocity: decreased   General Gait Details: Pt very fearful of falling and prefers to limit mobility to transfers. Her home is accessible throughout, including bathroom, with her electric scooter.  Stairs            Wheelchair Mobility    Modified Rankin (Stroke Patients Only)       Balance                                             Pertinent Vitals/Pain Pain Assessment: 0-10 Pain Score:  6  Pain Location: sx site Pain Descriptors / Indicators: Burning;Aching Pain Intervention(s): Monitored during session;Premedicated before session    Home Living Family/patient expects to be discharged to:: Private residence Living Arrangements: Spouse/significant other Available Help at Discharge: Available 24 hours/day Type of Home: House Home Access: Stairs to enter   [employer] of Steps: 2 Home Layout: One level Home Equipment: Environmental consultant - 2 wheels;Electric scooter      Prior Function Level of Independence: Independent with assistive device(s)         Comments: Pt prefers to go up/down steps on her bottom to enter/exit house.     Hand Dominance        Extremity/Trunk Assessment                         Communication   Communication: No difficulties  Cognition Arousal/Alertness: Awake/alert Behavior During Therapy: WFL for tasks assessed/performed Overall Cognitive Status: Within Functional Limits for tasks assessed                      General Comments      Exercises        Assessment/Plan    PT Assessment Patent does not need any further PT services  PT Diagnosis Difficulty walking;Acute pain   PT Problem List    PT Treatment Interventions     PT Goals (  Current goals can be found in the Care Plan section) Acute Rehab PT Goals Patient Stated Goal: home today PT Goal Formulation: All assessment and education complete, DC therapy    Frequency     Barriers to discharge        Co-evaluation               End of Session Equipment Utilized During Treatment: Gait belt Activity Tolerance: Patient tolerated treatment well Patient left: in bed;with call bell/phone within reach Nurse Communication: Mobility status;Other (comment) (Pt's CAM boot is missing.)    Functional Assessment Tool Used: clinical judgement Functional Limitation: Mobility: Walking and moving around Mobility: Walking and Moving Around Current  Status [phone]): At least 1 percent but less than 20 percent impaired, limited or restricted Mobility: Walking and Moving Around Goal Status [phone]): At least 1 percent but less than 20 percent impaired, limited or restricted Mobility: Walking and Moving Around Discharge Status [phone]): At least 1 percent but less than 20 percent impaired, limited or restricted    Time: 0902-0915 PT Time Calculation (min) (ACUTE ONLY): 13 min   Charges:   PT Evaluation $Initial PT Evaluation Tier I: 1 Procedure     PT G Codes:   PT G-Codes **NOT FOR INPATIENT CLASS** Functional Assessment Tool Used: clinical judgement Functional Limitation: Mobility: Walking and moving around Mobility: Walking and Moving Around Current Status [phone]): At least 1 percent but less than 20 percent impaired, limited or restricted Mobility: Walking and Moving Around Goal Status [phone]): At least 1 percent but less than 20 percent impaired, limited or restricted Mobility: Walking and Moving Around Discharge Status [phone]): At least 1 percent but less than 20 percent impaired, limited or restricted    [name] 12/08/2014, 9:42 AM

## 2015-12-13 ENCOUNTER — Telehealth (INDEPENDENT_AMBULATORY_CARE_PROVIDER_SITE_OTHER): Payer: Self-pay | Admitting: *Deleted

## 2015-12-13 NOTE — Telephone Encounter (Signed)
Pt. Needs records sent to [facility] Preston Fleeting III [phone] fax [phone]. Pt has an appt Dec 1st. Address is [place]. ATTN: NEW PT INFO. Pt is not able to come in to sign medical release, she no longer lives in town.

## 2015-12-14 NOTE — Telephone Encounter (Signed)
I called patient and advised that we do need a signed release form and she stated she would be in town tomorrow and would complete then.

## 2015-12-20 IMAGING — CR DG ANKLE COMPLETE 3+V*L*
3 series · 3 of 3 positions shown · non-contrast
Comparison: Radiograph earlier today.

CLINICAL DATA: Post reduction.  Recent fall.

EXAM:
LEFT ANKLE COMPLETE - 3+ VIEW

[x ankle ap left]
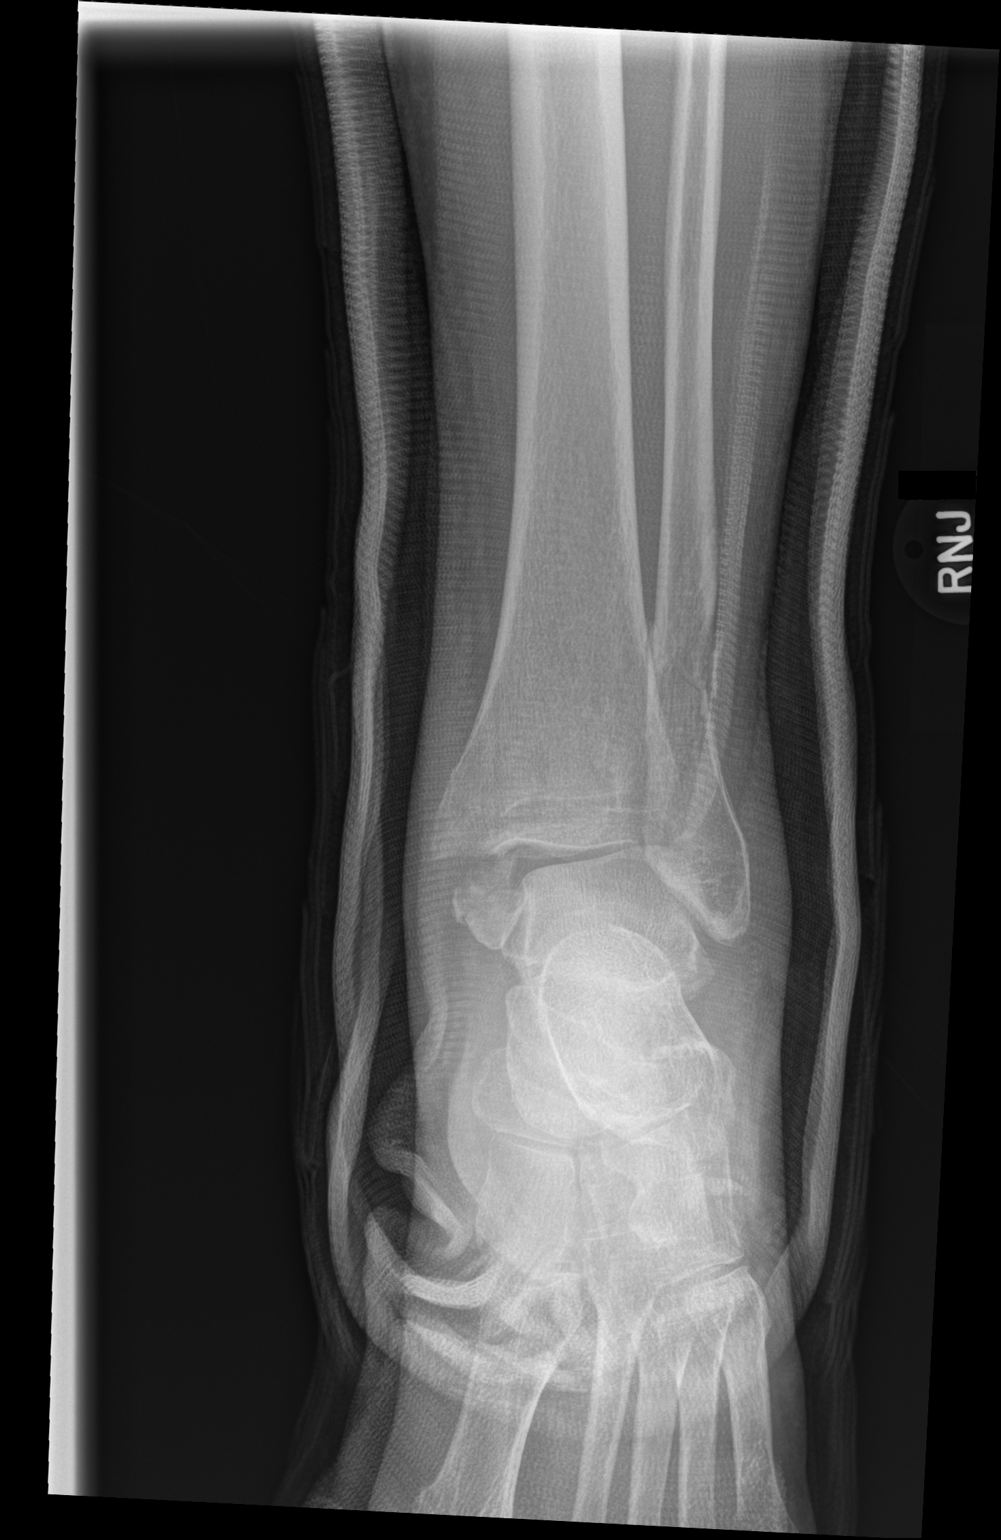

[x ankle obl left]
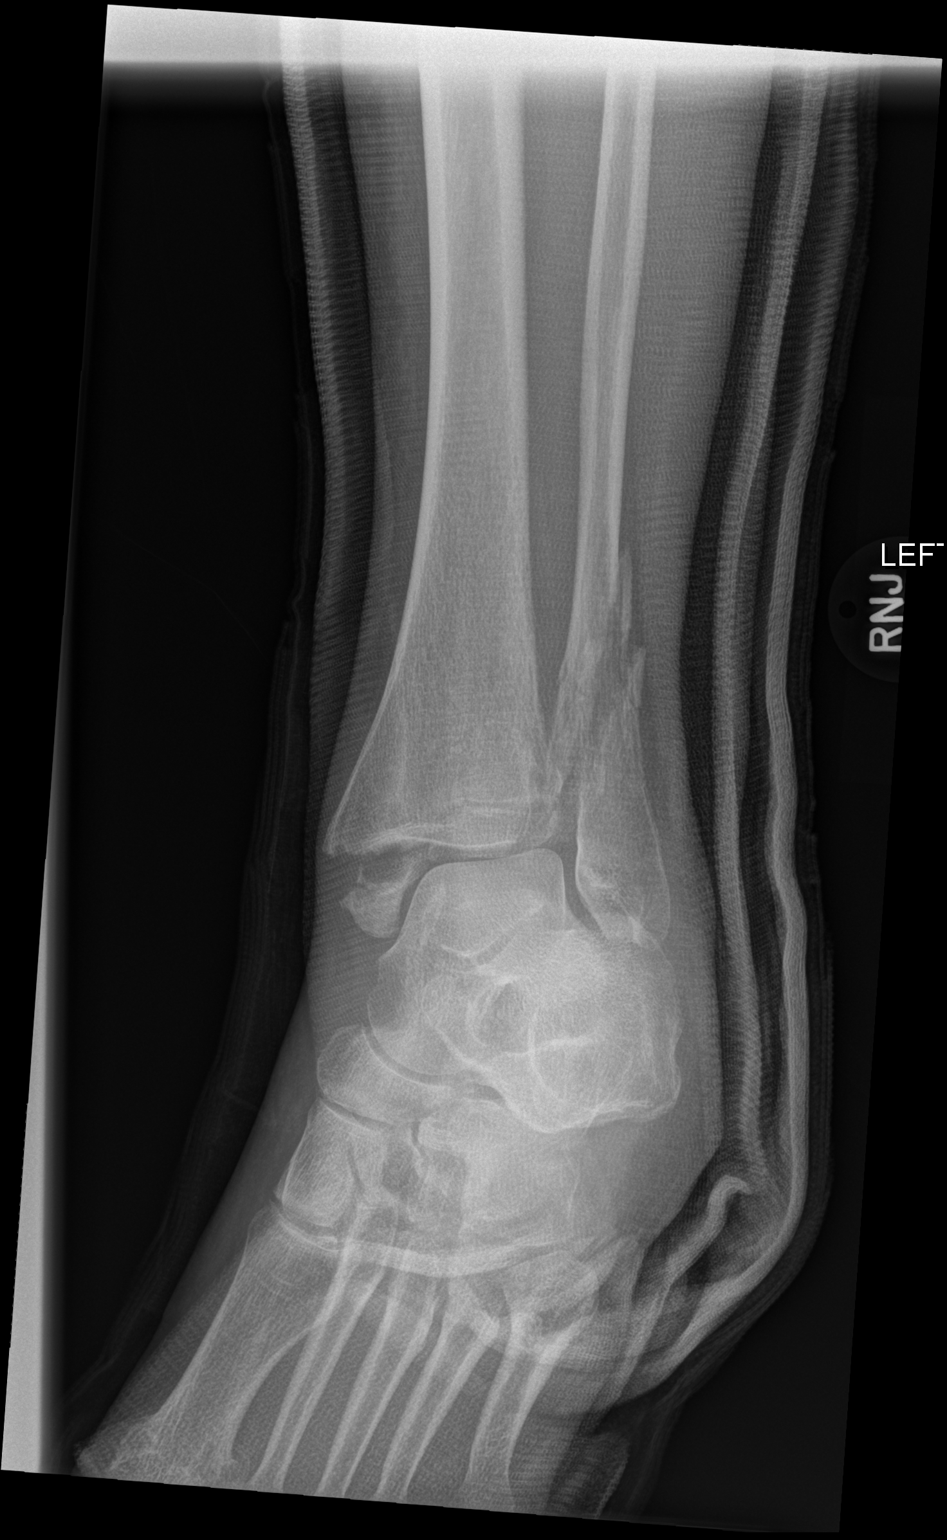

[x ankle lat left]
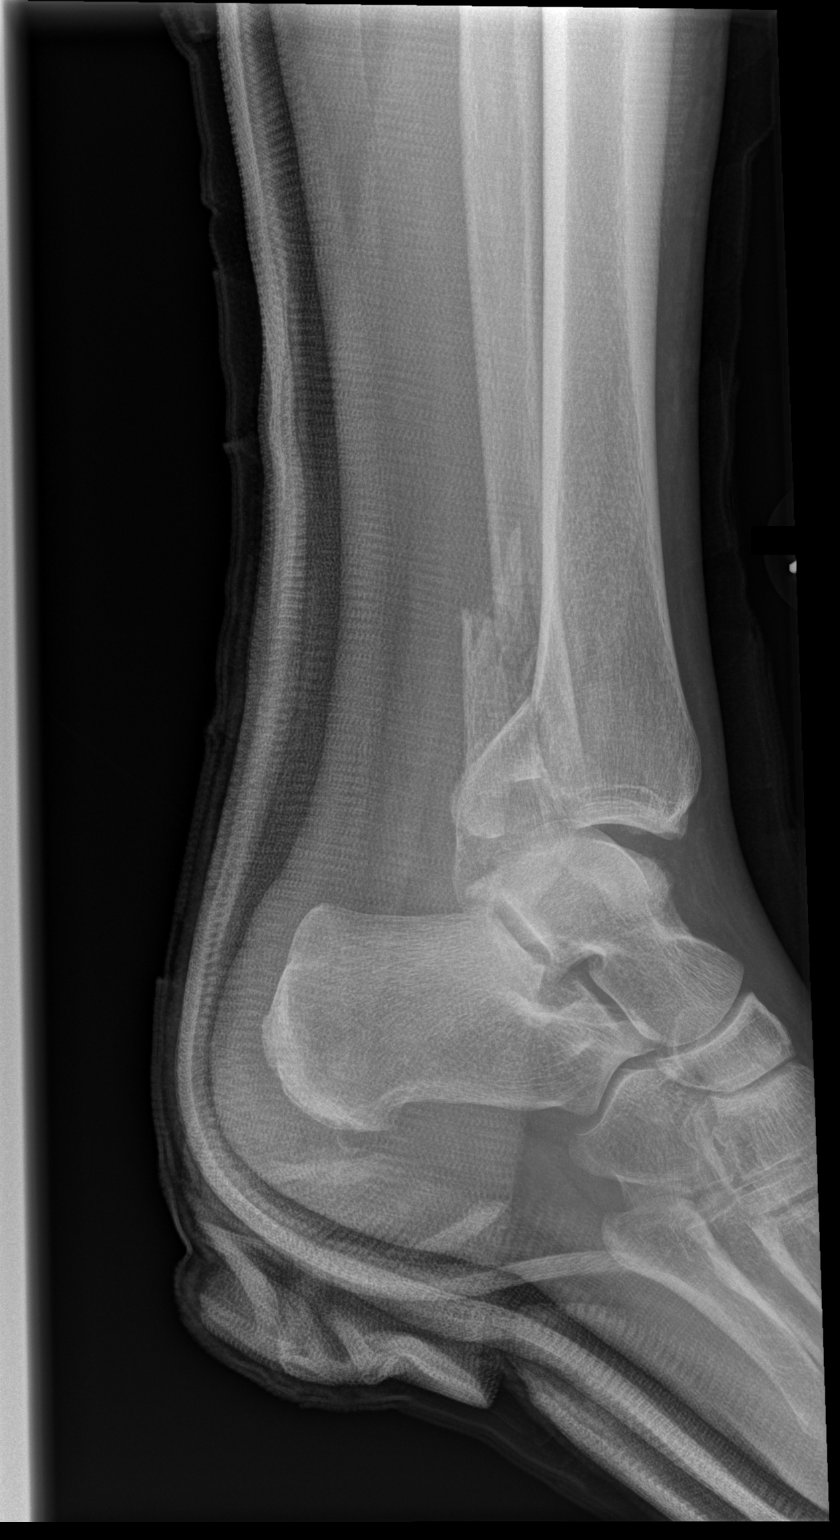

[3 of 3 positions shown; findings below may reference images not displayed]

FINDINGS: Improved position alignment of trimalleolar fracture and posterior
dislocation. There is still significant disruption of the ankle
mortise, and anterior displacement of the tibia on the talus.
Posterior splint satisfactory position.
IMPRESSION: As above.

## 2015-12-20 IMAGING — CR DG ANKLE COMPLETE 3+V*L*
3 series · 3 of 3 positions shown · non-contrast
Comparison: None.

CLINICAL DATA: 63-year-old female with a history of fall and
injury.

EXAM:
LEFT ANKLE COMPLETE - 3+ VIEW

[x ankle ap left]
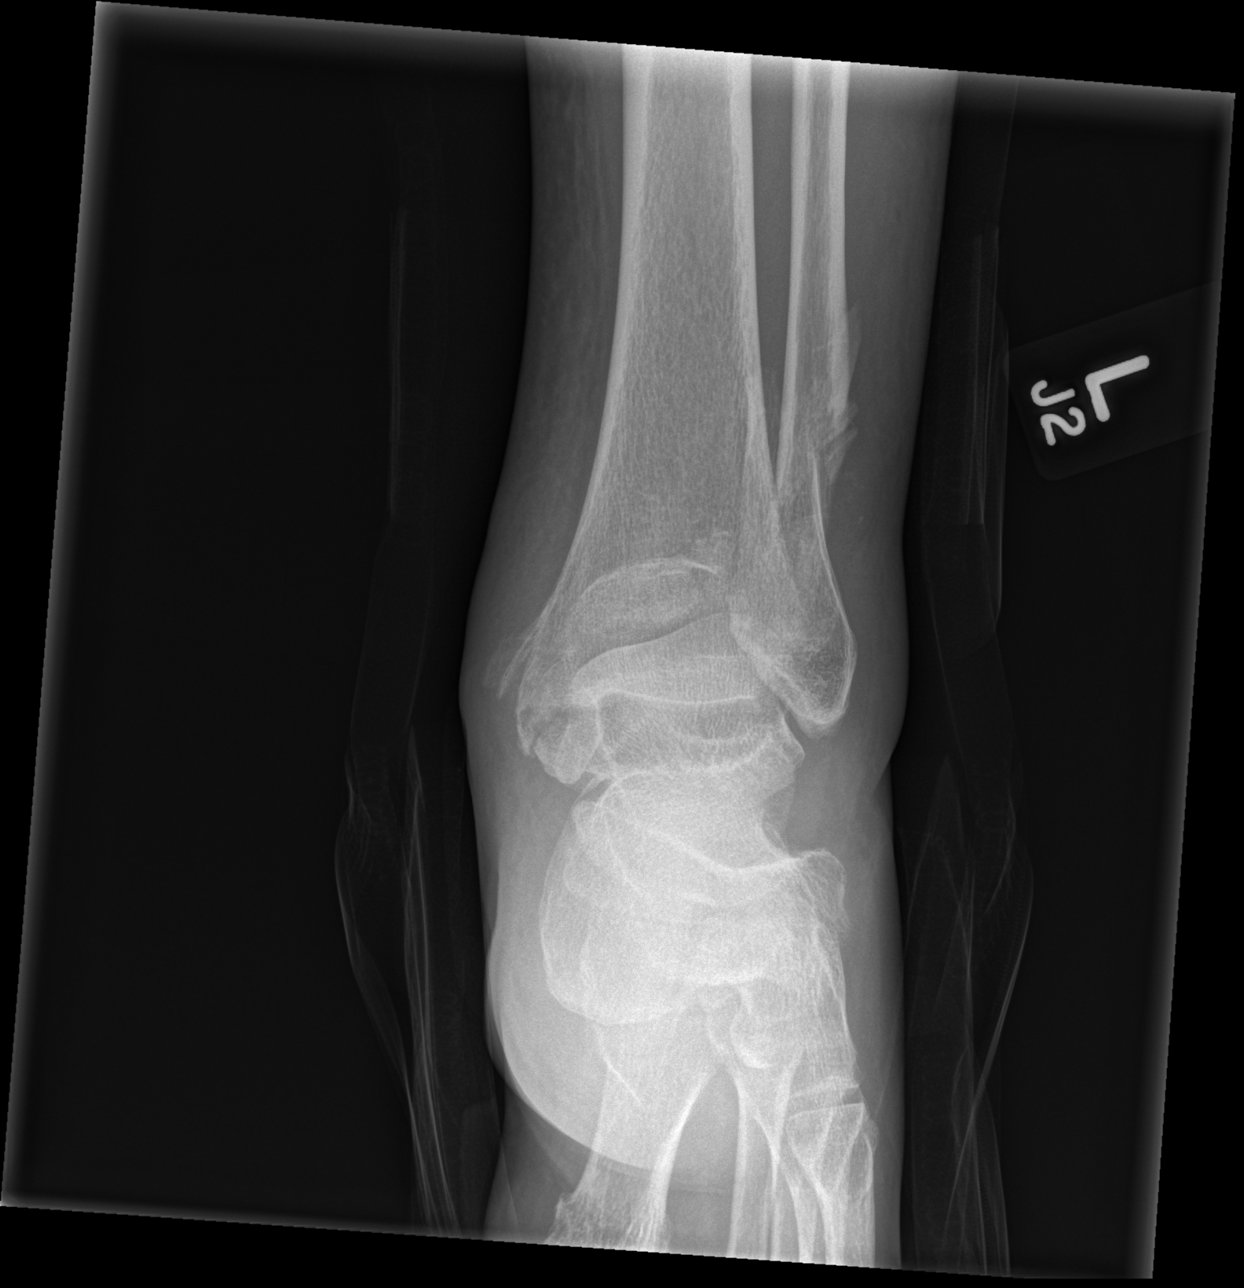

[x ankle obl left]
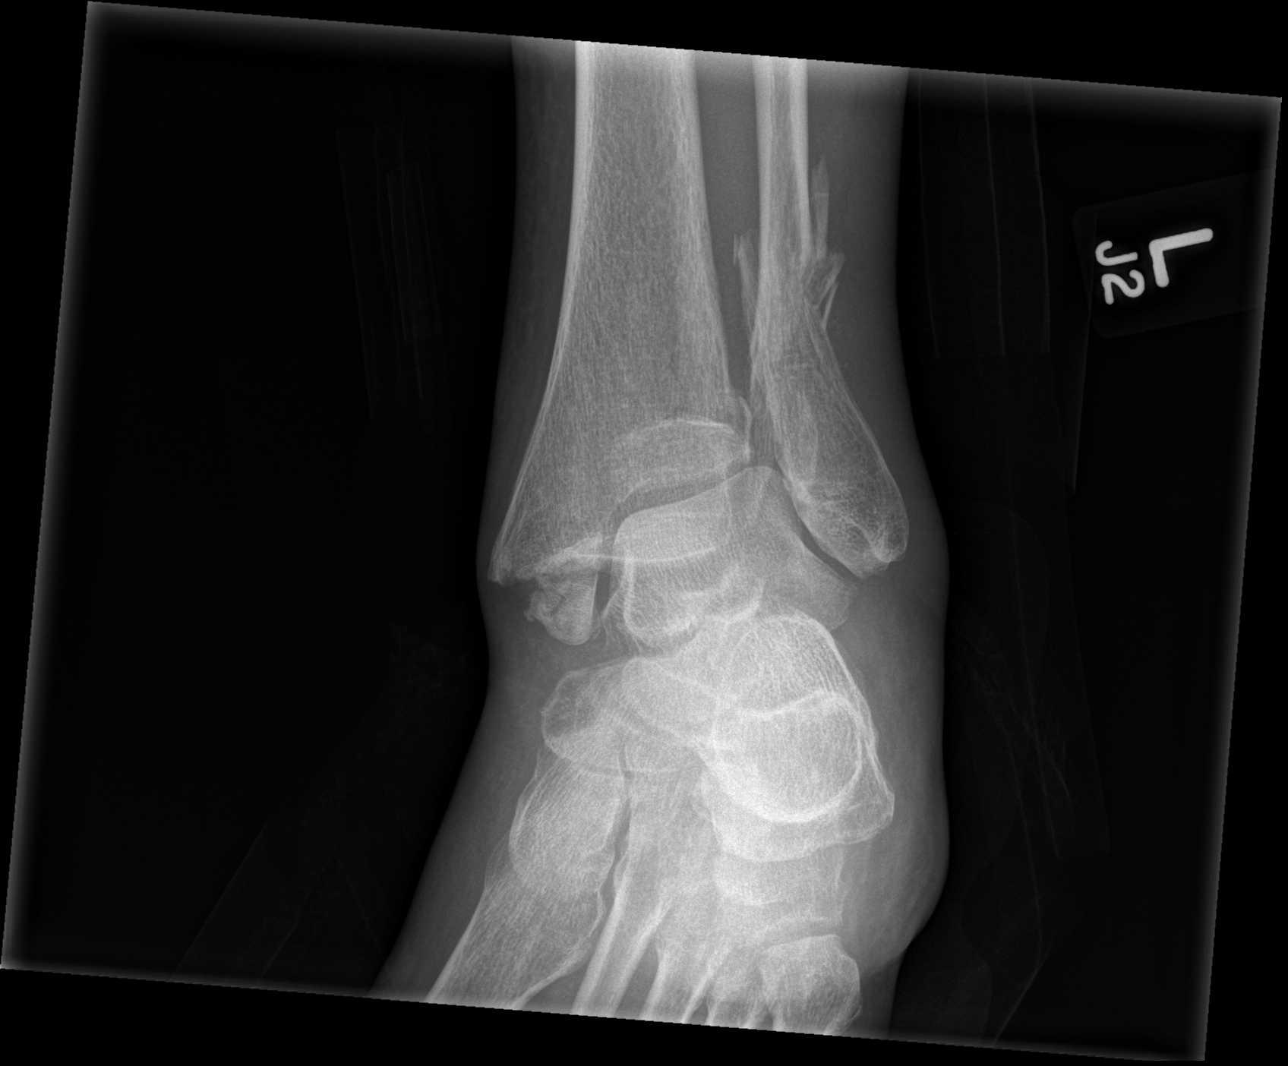

[x ankle lat left]
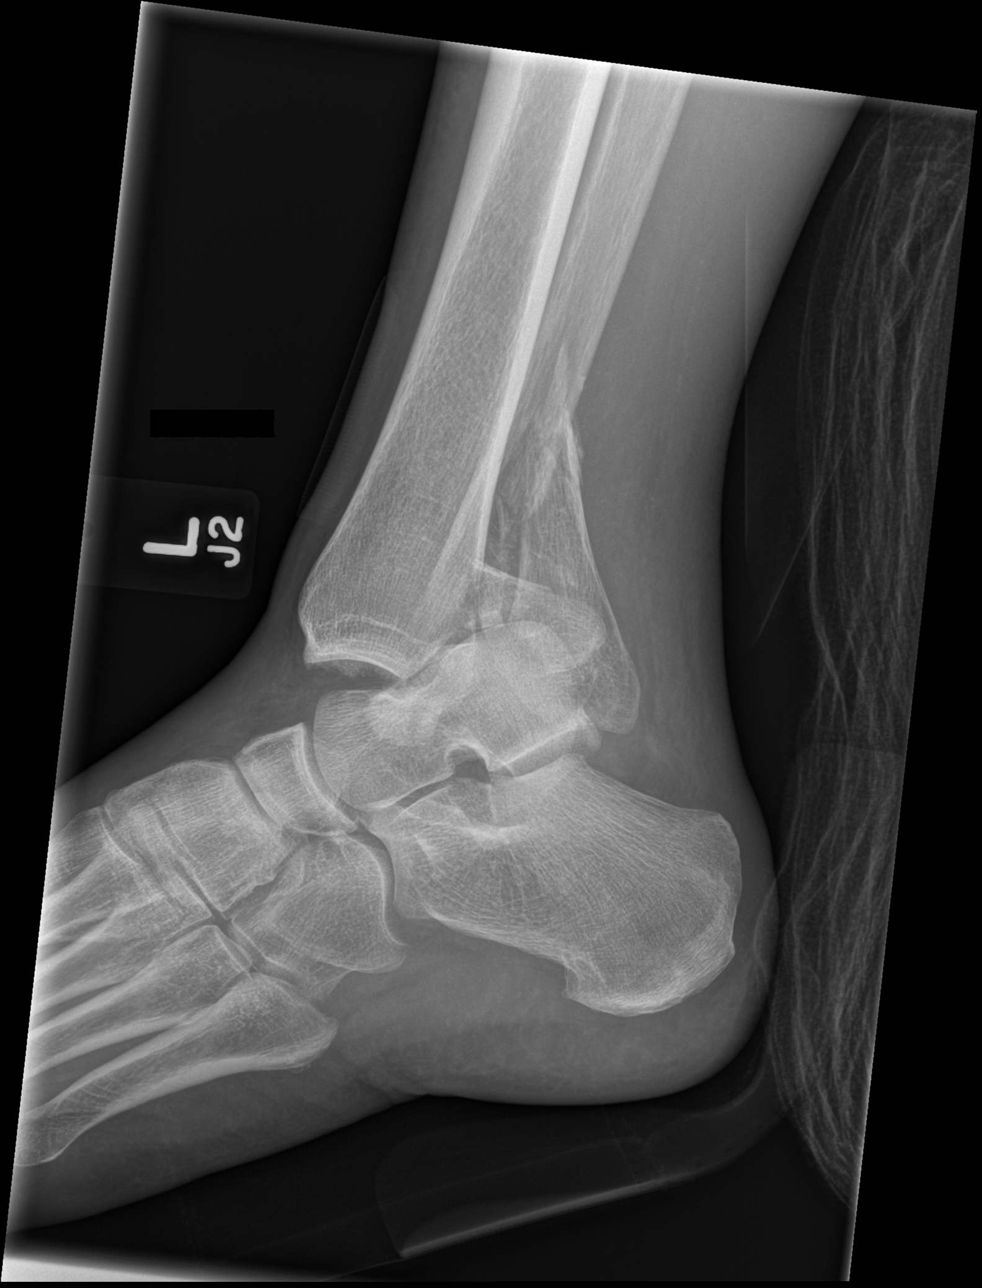

[3 of 3 positions shown; findings below may reference images not displayed]

FINDINGS: Fracture dislocation at the ankle.

Comminuted fracture of the distal fibular diaphysis, above the ankle
mortise, with 23 degrees of angulation anteriorly.

Comminuted fracture of the distal tibia, involving the posterior
malleolus and the medial malleolus. There has been posterior
dislocation of the talus from the ankle mortise.
IMPRESSION: Fracture dislocation of the ankle, with posterior dislocation of the
talus.

Comminuted fracture of the distal tibia involving the medial
malleolus and the posterior malleolus. There is also comminuted
fracture of the distal fibula, with approximately 23 degrees of
anterior angulation at the fracture site

## 2016-01-05 ENCOUNTER — Telehealth (INDEPENDENT_AMBULATORY_CARE_PROVIDER_SITE_OTHER): Payer: Self-pay | Admitting: Orthopedic Surgery

## 2016-01-05 NOTE — Telephone Encounter (Signed)
Patient called about the release she signed for her records to go to [employer].  I told her those were faxed yesterday. She will check with them and call me back if I need to refax them.

## 2022-06-04 ENCOUNTER — Encounter: Payer: Self-pay | Admitting: Internal Medicine
